# Patient Record
Sex: Female | Born: 1950 | Race: White | Hispanic: No | Marital: Married | Smoking: Never smoker
Health system: Southern US, Community
[De-identification: ages and names within clinical notes are randomized; demographics above are authoritative.]

## PROBLEM LIST (undated history)

## (undated) DIAGNOSIS — G629 Polyneuropathy, unspecified: Secondary | ICD-10-CM

## (undated) DIAGNOSIS — Z8659 Personal history of other mental and behavioral disorders: Secondary | ICD-10-CM

## (undated) HISTORY — PX: RHINOPLASTY: SUR1284

## (undated) HISTORY — PX: TONSILLECTOMY: SUR1361

## (undated) HISTORY — PX: CHOLECYSTECTOMY: SHX55

## (undated) HISTORY — PX: APPENDECTOMY: SHX54

## (undated) HISTORY — DX: Polyneuropathy, unspecified: G62.9

## (undated) HISTORY — DX: Personal history of other mental and behavioral disorders: Z86.59

---

## 2005-10-02 ENCOUNTER — Ambulatory Visit: Payer: Self-pay | Admitting: Internal Medicine

## 2007-12-06 ENCOUNTER — Ambulatory Visit: Payer: Self-pay | Admitting: Urology

## 2008-01-24 ENCOUNTER — Ambulatory Visit: Payer: Self-pay | Admitting: Internal Medicine

## 2010-12-12 ENCOUNTER — Ambulatory Visit: Payer: Self-pay | Admitting: Otolaryngology

## 2013-03-17 DIAGNOSIS — G4700 Insomnia, unspecified: Secondary | ICD-10-CM | POA: Insufficient documentation

## 2013-03-17 DIAGNOSIS — Z8659 Personal history of other mental and behavioral disorders: Secondary | ICD-10-CM | POA: Insufficient documentation

## 2014-05-03 DIAGNOSIS — G609 Hereditary and idiopathic neuropathy, unspecified: Secondary | ICD-10-CM | POA: Insufficient documentation

## 2014-06-05 DIAGNOSIS — G629 Polyneuropathy, unspecified: Secondary | ICD-10-CM | POA: Insufficient documentation

## 2016-08-25 ENCOUNTER — Other Ambulatory Visit: Payer: Self-pay | Admitting: Pediatrics

## 2016-08-25 DIAGNOSIS — N644 Mastodynia: Secondary | ICD-10-CM

## 2016-08-27 ENCOUNTER — Inpatient Hospital Stay
Admission: RE | Admit: 2016-08-27 | Discharge: 2016-08-27 | Disposition: A | Discharge status: Home / Self Care | Payer: Self-pay | Source: Ambulatory Visit | Attending: *Deleted | Admitting: *Deleted

## 2016-08-27 ENCOUNTER — Other Ambulatory Visit: Payer: Self-pay | Admitting: *Deleted

## 2016-08-27 ENCOUNTER — Other Ambulatory Visit: Payer: Self-pay | Admitting: Pediatrics

## 2016-08-27 DIAGNOSIS — Z9289 Personal history of other medical treatment: Secondary | ICD-10-CM

## 2016-08-27 DIAGNOSIS — N644 Mastodynia: Secondary | ICD-10-CM

## 2016-09-08 ENCOUNTER — Ambulatory Visit
Admission: RE | Admit: 2016-09-08 | Discharge: 2016-09-08 | Disposition: A | Discharge status: Home / Self Care | Payer: Medicare Other | Source: Ambulatory Visit | Attending: Pediatrics | Admitting: Pediatrics

## 2016-09-08 ENCOUNTER — Encounter: Payer: Self-pay | Admitting: Radiology

## 2016-09-08 DIAGNOSIS — N644 Mastodynia: Secondary | ICD-10-CM

## 2017-10-29 ENCOUNTER — Other Ambulatory Visit: Payer: Self-pay | Admitting: Otolaryngology

## 2017-10-29 DIAGNOSIS — R42 Dizziness and giddiness: Secondary | ICD-10-CM

## 2017-11-12 ENCOUNTER — Ambulatory Visit
Admission: RE | Admit: 2017-11-12 | Discharge: 2017-11-12 | Disposition: A | Discharge status: Home / Self Care | Payer: Medicare Other | Source: Ambulatory Visit | Attending: Otolaryngology | Admitting: Otolaryngology

## 2017-11-12 ENCOUNTER — Other Ambulatory Visit
Admission: RE | Admit: 2017-11-12 | Discharge: 2017-11-12 | Disposition: A | Discharge status: Home / Self Care | Payer: Medicare Other | Source: Ambulatory Visit | Attending: Otolaryngology | Admitting: Otolaryngology

## 2017-11-12 ENCOUNTER — Encounter (INDEPENDENT_AMBULATORY_CARE_PROVIDER_SITE_OTHER): Payer: Self-pay

## 2017-11-12 DIAGNOSIS — M47812 Spondylosis without myelopathy or radiculopathy, cervical region: Secondary | ICD-10-CM | POA: Diagnosis not present

## 2017-11-12 DIAGNOSIS — M4802 Spinal stenosis, cervical region: Secondary | ICD-10-CM | POA: Insufficient documentation

## 2017-11-12 DIAGNOSIS — R42 Dizziness and giddiness: Secondary | ICD-10-CM | POA: Diagnosis present

## 2017-11-12 LAB — CREATININE, SERUM
Creatinine, Ser: 0.9 mg/dL (ref 0.44–1.00)
GFR calc Af Amer: >60 mL/min (ref >60)
GFR calc non Af Amer: >60 mL/min (ref >60)

## 2017-11-12 MED ORDER — GADOBUTROL 1 MMOL/ML IV SOLN
5.0000 mL | Freq: Once | INTRAVENOUS | Status: AC | PRN
  Administered 2017-11-12: 5 mL via INTRAVENOUS

## 2018-11-22 ENCOUNTER — Other Ambulatory Visit: Payer: Self-pay

## 2018-11-22 ENCOUNTER — Encounter: Payer: Self-pay | Admitting: Gastroenterology

## 2018-11-22 ENCOUNTER — Ambulatory Visit (INDEPENDENT_AMBULATORY_CARE_PROVIDER_SITE_OTHER): Payer: Medicare Other | Admitting: Gastroenterology

## 2018-11-22 VITALS — BP 106/70 | HR 74 | Temp 98.0°F | Ht 63.0 in | Wt 128.0 lb

## 2018-11-22 DIAGNOSIS — K219 Gastro-esophageal reflux disease without esophagitis: Secondary | ICD-10-CM | POA: Diagnosis not present

## 2018-11-22 DIAGNOSIS — R197 Diarrhea, unspecified: Secondary | ICD-10-CM

## 2018-11-22 NOTE — Progress Notes (Signed)
Gastroenterology Consultation  Referring Provider:     Orene Desanctis, MD Primary Care Physician:  [facility] Primary Gastroenterologist:  Dr. Servando Sandoval     Reason for Consultation:     GERD and diarrhea        HPI:   Ann Sandoval is a 68 y.o. y/o female referred for consultation & management of GERD and diarrhea by Dr. Dan Sandoval, [facility].  The patient comes in today after being seen by ENT for recurrent runny nose and upper respiratory tract issues.  The patient reports that she was diagnosed with reflux and started on medication for this.  She is concerned about the long-term effects of a PPI.  She states that when this was all going on she was under a lot of stress and lost 18 pounds.  At the same time she was having some construction at her house and when that stopped she gained half the way back.  The patient denies any black stools or bloody stools and had a Cologuard test done recently that was negative.  She reports that she had 2 colonoscopies prior to that and reports that those was normal.  There is no report of any dysphagia associated with the reflux. The patient also reports that she has had diarrhea for many years with mucus passing from her bottom.  She denies the diarrhea waking her up at night but states that she has urgency to go to the bathroom shortly after eating.  She also notices that her diarrhea can be worse with different foods she eats. She endorses being under a lot of pressure and is somewhat emotional with me in the office today.  She is supposed to see somebody to talk to her about putting her on medication for her depression.  Past Medical History:  Diagnosis Date  . History of depression   . Polyneuropathy     Past Surgical History:  Procedure Laterality Date  . APPENDECTOMY    . CHOLECYSTECTOMY    . RHINOPLASTY    . TONSILLECTOMY      Prior to Admission medications   Not on File    Family History  Problem Relation Age of Onset   . Alzheimer's disease Mother   . Depression Mother   . Stroke Father   . Coronary artery disease Father   . Brain cancer Sister   . Depression Sister   . Coronary artery disease Brother   . Depression Maternal Grandmother   . Depression Maternal Grandfather   . Stroke Paternal Grandmother   . Stroke Paternal Grandfather   . Breast cancer Neg Hx      Social History   Tobacco Use  . Smoking status: Never Smoker  . Smokeless tobacco: Never Used  Substance Use Topics  . Alcohol use: Yes    Comment: Occasional alcohol  . Drug use: Never    Allergies as of 11/22/2018  . (No Known Allergies)    Review of Systems:    All systems reviewed and negative except where noted in HPI.   Physical Exam:  BP 106/70   Pulse 74   Temp 98 F (36.7 C) (Temporal)   Ht 5\' 3"  (1.6 m)   Wt 128 lb (58.1 kg)   BMI 22.67 kg/m  No LMP recorded. Patient is postmenopausal. General:   Alert,  Well-developed, well-nourished, pleasant and cooperative in NAD Head:  Normocephalic and atraumatic. Eyes:  Sclera clear, no icterus.   Conjunctiva pink. Ears:  Normal auditory  acuity. Nose:  No deformity, discharge, or lesions. Mouth:  No deformity or lesions,oropharynx pink & moist. Neck:  Supple; no masses or thyromegaly. Lungs:  Respirations even and unlabored.  Clear throughout to auscultation.   No wheezes, crackles, or rhonchi. No acute distress. Heart:  Regular rate and rhythm; no murmurs, clicks, rubs, or gallops. Abdomen:  Normal bowel sounds.  No bruits.  Soft, non-tender and non-distended without masses, hepatosplenomegaly or hernias noted.  No guarding or rebound tenderness.  Negative Carnett sign.   Rectal:  Deferred.  Msk:  Symmetrical without gross deformities.  Good, equal movement & strength bilaterally. Pulses:  Normal pulses noted. Extremities:  No clubbing or edema.  No cyanosis. Neurologic:  Alert and oriented x3;  grossly normal neurologically. Skin:  Intact without significant  lesions or rashes.  No jaundice. Lymph Nodes:  No significant cervical adenopathy. Psych:  Alert and cooperative. Normal mood and affect.  Imaging Studies: No results found.  Assessment and Plan:   Ann Sandoval is a 68 y.o. y/o female who comes in today with a history of heartburn when she was younger but now comes in with extraintestinal manifestations of reflux.  The patient has been explained the possible risks of taking a PPI and has also been explained the risks of not taking a PPI including continued upper respiratory tract issues, strictures and esophageal cancers from reflux. As for the diarrhea the patient has been told that it is possible that her diarrhea can be caused by her dairy intake since she has daily use of milk products.  She has been told to try and avoid milk products for 1 week to see if her symptoms improve.  If they do not the patient has been told to go back on dairy and start a high-fiber diet.  If her symptoms again do not improve then she may need to be tried on cholestyramine for possible bilious diarrhea since she has had her gallbladder out.  The patient has been explained the plan and agrees with it.     Ann Lame, MD. Marval Regal    Note: This dictation was prepared with Dragon dictation along with smaller phrase technology. Any transcriptional errors that result from this process are unintentional.

## 2019-01-19 ENCOUNTER — Telehealth: Payer: Self-pay | Admitting: Gastroenterology

## 2019-01-19 ENCOUNTER — Other Ambulatory Visit: Payer: Self-pay

## 2019-01-19 NOTE — Telephone Encounter (Signed)
Patient called & would like to know if Dr Servando Snare will continue to prescribe Pantoprazole 20mg  continued release? She uses [facility] .

## 2019-01-19 NOTE — Telephone Encounter (Signed)
Pt was hoping Dr Servando Snare would take over the rx for Pantoprazole. Ok'd to refill once she has completed her original rx given by ENT.

## 2019-01-19 NOTE — Telephone Encounter (Signed)
You can prescribe her the pantoprazole 20 mg that she was taking and no the medication does not have a separate preparation for extended release they are all indicated for once a day.

## 2019-01-19 NOTE — Telephone Encounter (Signed)
See message below. Does Pantoprazole come in extended release?

## 2019-03-05 ENCOUNTER — Ambulatory Visit: Payer: No Typology Code available for payment source

## 2019-03-06 ENCOUNTER — Ambulatory Visit: Payer: Medicare Other | Attending: Internal Medicine

## 2019-03-06 DIAGNOSIS — Z23 Encounter for immunization: Secondary | ICD-10-CM | POA: Insufficient documentation

## 2019-03-06 NOTE — Progress Notes (Signed)
   Covid-19 Vaccination Clinic  Name:  Ann Sandoval    MRN: [id number] DOB: 03-15-1950  03/06/2019  Ms. Ann Sandoval was observed post Covid-19 immunization for 15 minutes without incidence. She was provided with Vaccine Information Sheet and instruction to access the V-Safe system.   Ms. Ann Sandoval was instructed to call 911 with any severe reactions post vaccine: Marland Kitchen Difficulty breathing  . Swelling of your face and throat  . A fast heartbeat  . A bad rash all over your body  . Dizziness and weakness    Immunizations Administered    Name Date Dose VIS Date Route   Pfizer COVID-19 Vaccine 03/06/2019  3:11 PM 0.3 mL 12/24/2018 Intramuscular   Manufacturer: [employer], [employer]   Lot: [id number]   NDC: [address]-9

## 2019-03-15 ENCOUNTER — Ambulatory Visit: Payer: Self-pay

## 2019-03-29 ENCOUNTER — Ambulatory Visit: Payer: Medicare Other | Attending: Internal Medicine

## 2019-03-29 DIAGNOSIS — Z23 Encounter for immunization: Secondary | ICD-10-CM

## 2019-03-29 NOTE — Progress Notes (Signed)
   Covid-19 Vaccination Clinic  Name:  Ann Sandoval    MRN: [id number] DOB: 1950/07/03  03/29/2019  [name] was observed post Covid-19 immunization for 15 minutes without incident. She was provided with Vaccine Information Sheet and instruction to access the V-Safe system.   [name] was instructed to call 911 with any severe reactions post vaccine: Marland Kitchen Difficulty breathing  . Swelling of face and throat  . A fast heartbeat  . A bad rash all over body  . Dizziness and weakness   Immunizations Administered    Name Date Dose VIS Date Route   Pfizer COVID-19 Vaccine 03/29/2019  4:43 PM 0.3 mL 12/24/2018 Intramuscular   Manufacturer: [employer], [employer]   Lot: [id number]   NDC: [address]-4

## 2020-04-18 ENCOUNTER — Other Ambulatory Visit: Payer: Self-pay | Admitting: Otolaryngology

## 2020-04-18 ENCOUNTER — Other Ambulatory Visit (HOSPITAL_COMMUNITY): Payer: Self-pay | Admitting: Otolaryngology

## 2020-04-18 DIAGNOSIS — H9312 Tinnitus, left ear: Secondary | ICD-10-CM

## 2020-05-02 ENCOUNTER — Other Ambulatory Visit: Payer: Self-pay

## 2020-05-02 ENCOUNTER — Ambulatory Visit
Admission: RE | Admit: 2020-05-02 | Discharge: 2020-05-02 | Disposition: A | Discharge status: Home / Self Care | Payer: Medicare Other | Source: Ambulatory Visit | Attending: Otolaryngology | Admitting: Otolaryngology

## 2020-05-02 DIAGNOSIS — H9312 Tinnitus, left ear: Secondary | ICD-10-CM | POA: Diagnosis present

## 2020-05-02 MED ORDER — GADOBUTROL 1 MMOL/ML IV SOLN
5.0000 mL | Freq: Once | INTRAVENOUS | Status: AC | PRN
  Administered 2020-05-02: 5 mL via INTRAVENOUS

## 2020-10-29 ENCOUNTER — Other Ambulatory Visit: Payer: Self-pay | Admitting: Family Medicine

## 2020-10-29 DIAGNOSIS — E2839 Other primary ovarian failure: Secondary | ICD-10-CM

## 2020-10-29 DIAGNOSIS — Z1231 Encounter for screening mammogram for malignant neoplasm of breast: Secondary | ICD-10-CM

## 2022-09-08 ENCOUNTER — Other Ambulatory Visit: Payer: Self-pay | Admitting: Pediatrics

## 2022-09-08 DIAGNOSIS — E785 Hyperlipidemia, unspecified: Secondary | ICD-10-CM

## 2022-09-12 ENCOUNTER — Ambulatory Visit
Admission: RE | Admit: 2022-09-12 | Discharge: 2022-09-12 | Disposition: A | Discharge status: Home / Self Care | Payer: No Typology Code available for payment source | Source: Ambulatory Visit | Attending: Pediatrics | Admitting: Pediatrics

## 2022-09-12 DIAGNOSIS — E785 Hyperlipidemia, unspecified: Secondary | ICD-10-CM | POA: Insufficient documentation

## 2023-01-29 IMAGING — MR MR BRAIN/IAC WO/W CM
10 of 13 series · 26 of 48 positions shown · IV contrast (gadavist)
Comparison: 11/12/2017

CLINICAL DATA: Bilateral tinnitus

EXAM:
MRI HEAD WITHOUT AND WITH CONTRAST
TECHNIQUE: Multiplanar, multiecho pulse sequences of the brain and surrounding
structures were obtained without and with intravenous contrast.
CONTRAST:  5mL GADAVIST GADOBUTROL 1 MMOL/ML IV SOLN

[Series 5: T1 · sagittal · 5.0mm · 0.62mm/px · 1 of 25 slices shown (1 of 3)]
[im 1/25]
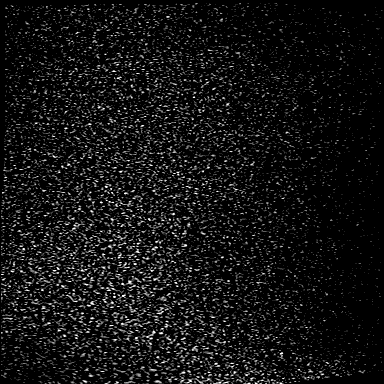

[Series 6: ax dwi_tracew · axial · 3.0mm · 0.60mm/px · z∈[-165,-19]mm · 4 of 48 slices shown]
[im 1/48]
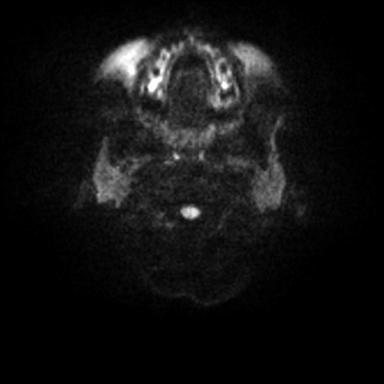
[im 16/48]
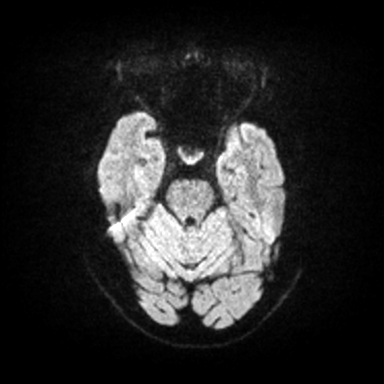
[im 32/48]
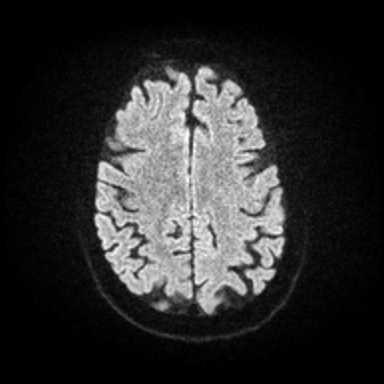
[im 48/48]
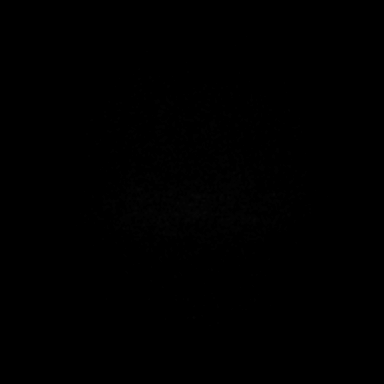

[Series 7: ax dwi_adc · axial · 3.0mm · 0.60mm/px · z∈[-165,-75]mm · 3 of 45 slices shown]
[im 1/45]
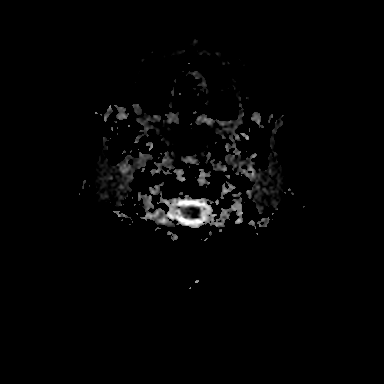
[im 15/45]
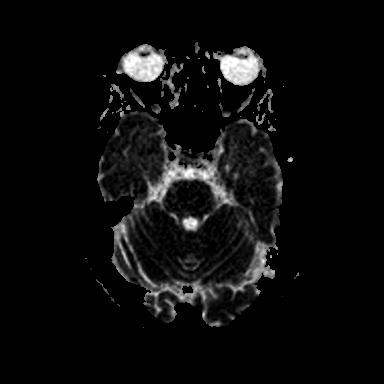
[im 30/45]
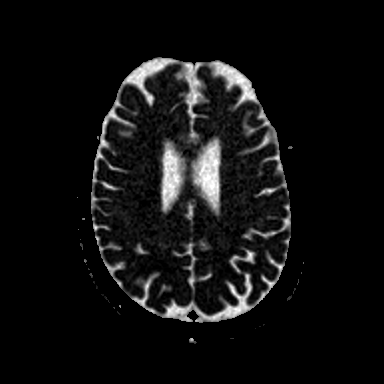

[Series 8: T2 · axial · 5.0mm · 0.53mm/px · z∈[-158,-22]mm · 2 of 25 slices shown]
[im 1/25]
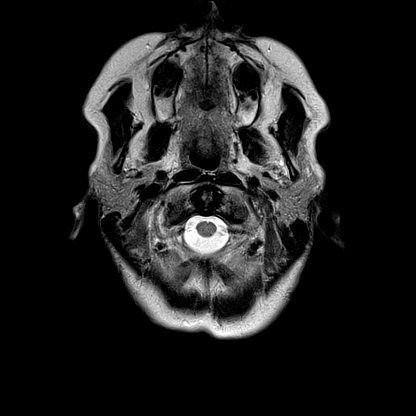
[im 25/25]
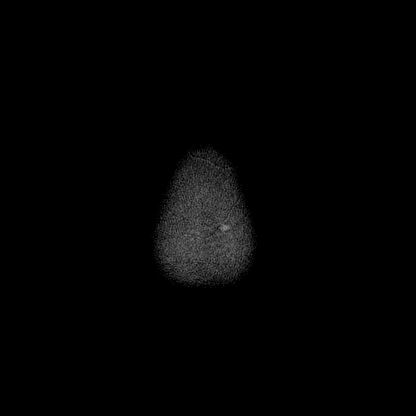

[Series 13: FLAIR · axial · 3.0mm · 0.53mm/px · z∈[-166,-13]mm · 4 of 55 slices shown]
[im 1/55]
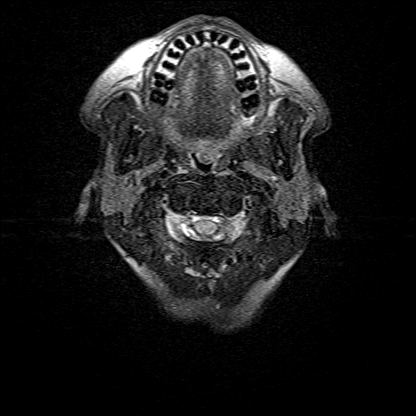
[im 19/55]
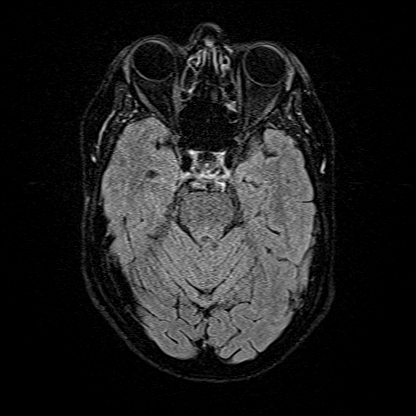
[im 37/55]
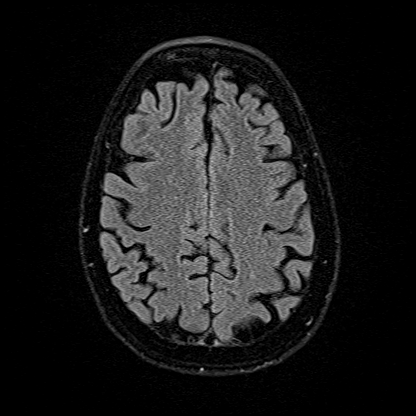
[im 55/55]
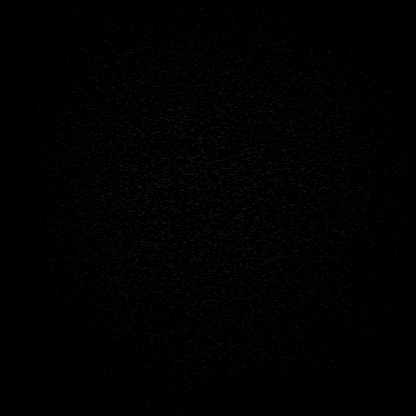

[Series 14: T1 · coronal · non-contrast · 3.0mm · 0.21mm/px · 1 of 13 slices shown (2 of 3)]
[im 1/13]
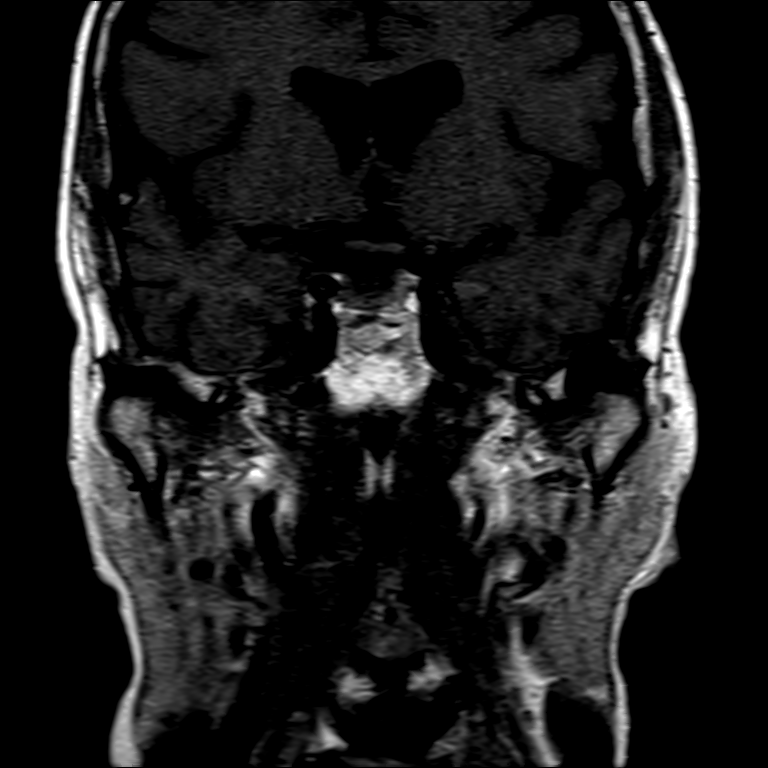

[Series 16: T1 · axial · non-contrast · 3.0mm · 0.21mm/px · 1 of 15 slices shown (3 of 3)]
[im 1/15]
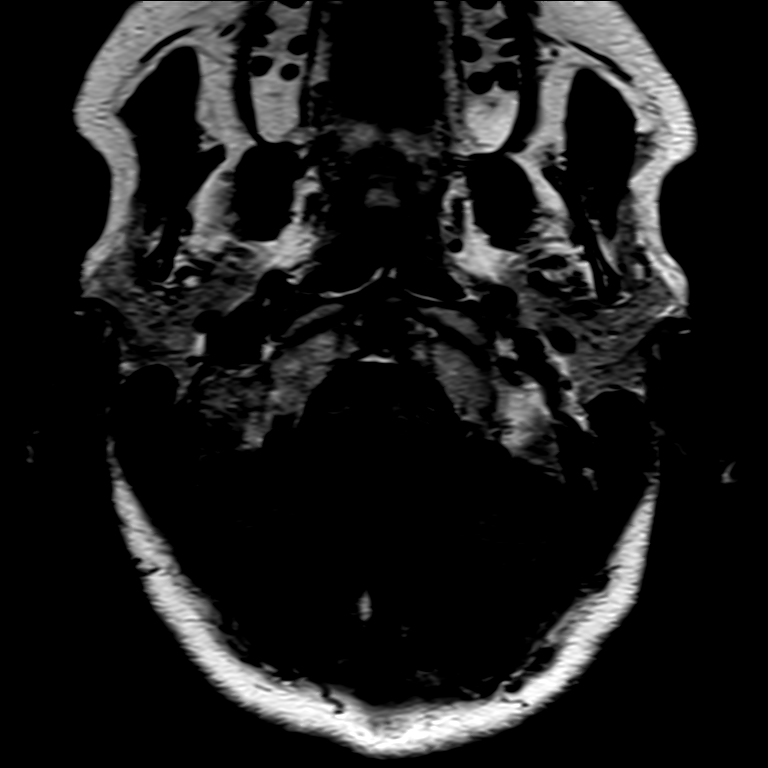

[Series 17: T1 post-contrast · axial · 3.0mm · 0.21mm/px · 1 of 15 slices shown (1 of 3)]
[im 1/15]
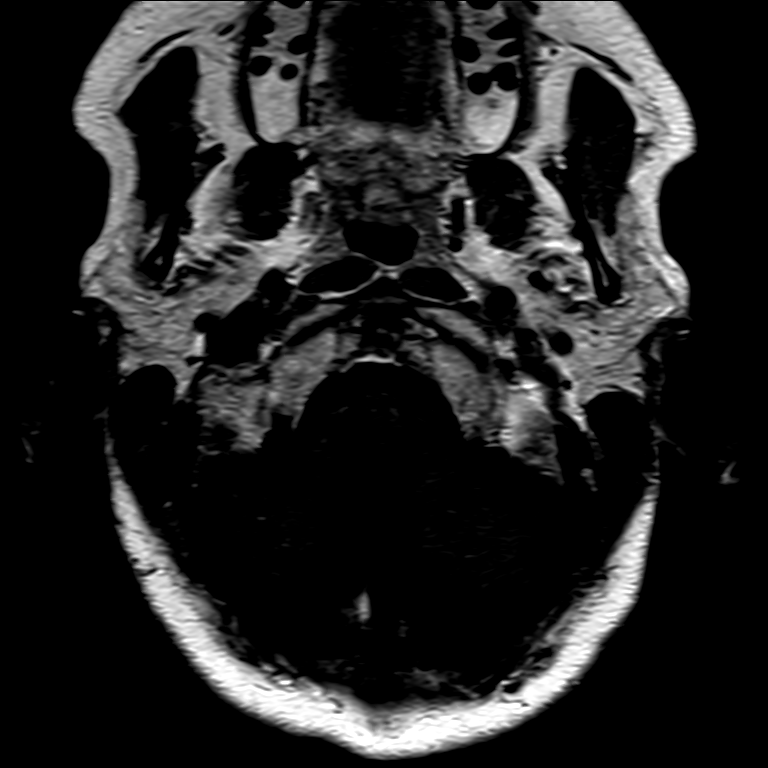

[Series 18: T1 post-contrast · coronal · 3.0mm · 0.21mm/px · 1 of 13 slices shown (2 of 3)]
[im 1/13]
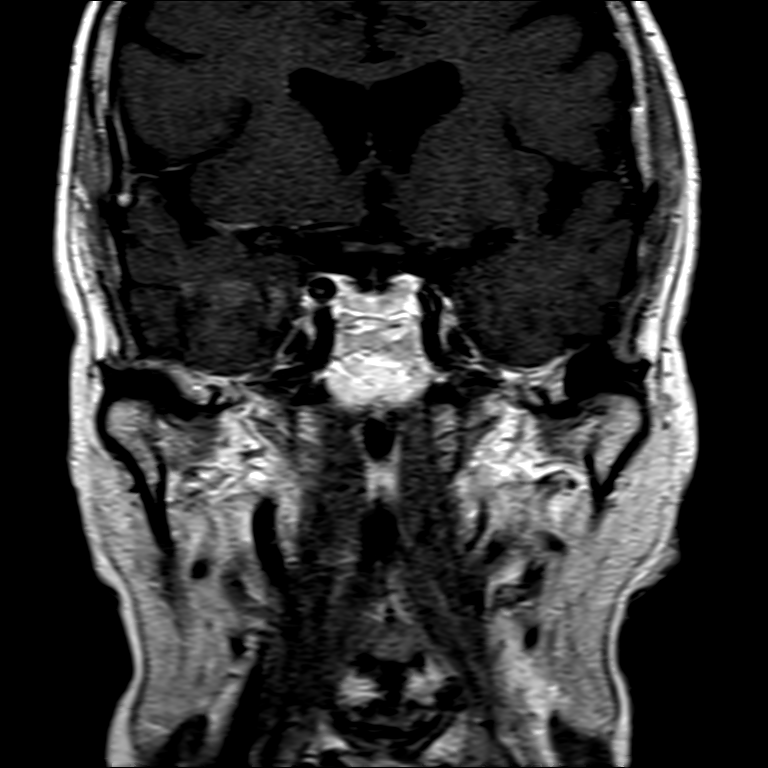

[Series 19: T1 post-contrast · axial · 1.0mm · 0.98mm/px · z∈[-178,-12]mm · 8 of 176 slices shown (3 of 3)]
[im 1/176]
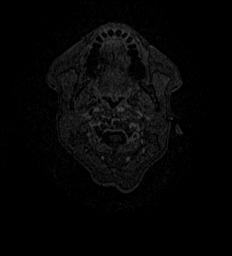
[im 27/176]
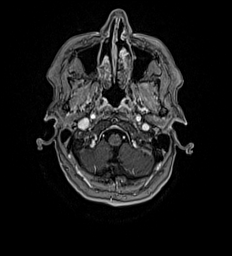
[im 54/176]
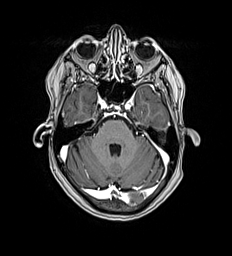
[im 81/176]
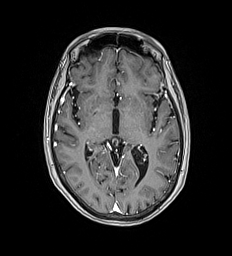
[im 95/176]
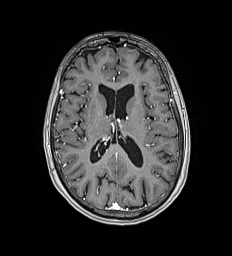
[im 122/176]
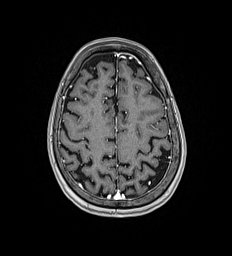
[im 149/176]
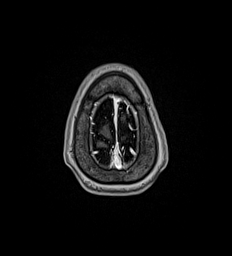
[im 176/176]
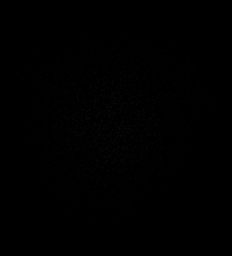

[26 of 48 positions shown; findings below may reference images not displayed]

FINDINGS: Brain: There is no cerebellopontine angle mass. Inner ear structures
demonstrate an unremarkable MR appearance. There is no abnormal
enhancement within the internal auditory canals.

Ventricles and sulci are within normal limits in size and
configuration. Punctate focus of susceptibility within the left
superior frontal gyrus is compatible with chronic microhemorrhage.
No acute infarction or intracranial hemorrhage. There is no mass
effect or edema. There is no extra-axial fluid collection.

Vascular: Major vessel flow voids at the skull base are preserved.

Skull and upper cervical spine: Normal marrow signal is preserved.

Sinuses/Orbits: Minor mucosal thickening. The orbits are
unremarkable.

Other: The sella is unremarkable.  Mastoid air cells are clear.
IMPRESSION: No evidence of recent infarction, mass, or abnormal enhancement.

## 2023-02-03 DIAGNOSIS — M7582 Other shoulder lesions, left shoulder: Secondary | ICD-10-CM | POA: Diagnosis not present

## 2023-02-03 DIAGNOSIS — N951 Menopausal and female climacteric states: Secondary | ICD-10-CM | POA: Diagnosis not present

## 2023-02-03 DIAGNOSIS — E538 Deficiency of other specified B group vitamins: Secondary | ICD-10-CM | POA: Diagnosis not present

## 2023-02-03 DIAGNOSIS — G4719 Other hypersomnia: Secondary | ICD-10-CM | POA: Diagnosis not present

## 2023-02-03 DIAGNOSIS — R7303 Prediabetes: Secondary | ICD-10-CM | POA: Diagnosis not present

## 2023-02-03 DIAGNOSIS — R5383 Other fatigue: Secondary | ICD-10-CM | POA: Diagnosis not present

## 2023-02-03 DIAGNOSIS — H9313 Tinnitus, bilateral: Secondary | ICD-10-CM | POA: Diagnosis not present

## 2023-02-05 DIAGNOSIS — K080 Exfoliation of teeth due to systemic causes: Secondary | ICD-10-CM | POA: Diagnosis not present

## 2023-02-26 DIAGNOSIS — H903 Sensorineural hearing loss, bilateral: Secondary | ICD-10-CM | POA: Diagnosis not present

## 2023-02-26 DIAGNOSIS — H9313 Tinnitus, bilateral: Secondary | ICD-10-CM | POA: Diagnosis not present

## 2023-05-25 ENCOUNTER — Ambulatory Visit (INDEPENDENT_AMBULATORY_CARE_PROVIDER_SITE_OTHER): Admitting: Family Medicine

## 2023-05-25 VITALS — BP 110/70 | HR 76 | Ht 63.0 in | Wt 127.4 lb

## 2023-05-25 DIAGNOSIS — M7521 Bicipital tendinitis, right shoulder: Secondary | ICD-10-CM

## 2023-05-25 DIAGNOSIS — M7540 Impingement syndrome of unspecified shoulder: Secondary | ICD-10-CM | POA: Diagnosis not present

## 2023-05-25 MED ORDER — MELOXICAM 15 MG PO TABS: 15.0000 mg | ORAL_TABLET | Freq: Every day | ORAL | 0 refills | Status: DC

## 2023-06-01 ENCOUNTER — Telehealth: Payer: Self-pay

## 2023-06-01 ENCOUNTER — Encounter: Payer: Self-pay | Admitting: Family Medicine

## 2023-06-01 DIAGNOSIS — M7540 Impingement syndrome of unspecified shoulder: Secondary | ICD-10-CM | POA: Insufficient documentation

## 2023-06-01 DIAGNOSIS — M7521 Bicipital tendinitis, right shoulder: Secondary | ICD-10-CM | POA: Insufficient documentation

## 2023-06-01 NOTE — Assessment & Plan Note (Signed)
 History of Present Illness Ann Sandoval is a 73 year old female who presents with bilateral shoulder pain. She is accompanied by a female companion who assists with her memory.  Bilateral shoulder pain began in the right shoulder in September 2024 and in the left shoulder around February 2025. The pain has progressed to a constant ache in both shoulders, primarily located in the front and radiating down the biceps, occasionally extending past the elbow. It is exacerbated by movements such as reaching overhead, dressing, and sleeping, as she cannot lay on her shoulders. She is unable to perform activities she previously could, such as swimming, doing push-ups, and reaching behind her back.  An x-ray of the right shoulder from September 2024 showed minor arthritis at the acromioclavicular joint but no significant arthritis in the ball and socket joint. There is no x-ray available for the left shoulder.  She has tried Celebrex for the shoulder pain but discontinued it due to lack of significant relief. She has not tried other medications like Tylenol or topical rubs and relies on herbal supplements. She has not been compliant with regular use of anti-inflammatory medications.  Her sleep is disturbed due to shoulder pain, and she experiences difficulty with daily activities due to limited shoulder mobility.  Physical Exam PALPATION: Minor tenderness at the right acromioclavicular joint. Non-tender at the bicipital groove, subacromial space, and trapezius. RANGE OF MOTION: Foreflexion with a 25-degree deficit bilaterally, painful. Full abduction elicits pain bilaterally. External rotation to 45 degrees from neutral elicits pain. STRENGTH: 5/5 strength in external rotation, painless against resistance. 5/5 strength in internal rotation bilaterally, minor discomfort. SPECIAL TESTS: Minor discomfort with isolated supraspinatus testing bilaterally, 5/5 strength. Positive Neer's test on the right. Positive  Zoila Hines' test on the right. Positive Speed's test. Equivocal to positive Yergason's test on the right. Equivocal Neer's test on the left. Equivocal Zoila Hines' test on the left. Negative Speed's test on the left. Negative Yergason's test on the left.  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 Positive Neer's and Zoila Hines' tests on the right. No significant rotator cuff tear. Likely due to overuse from swimming and exercise. Discussed inflammation control, activity modification, anti-inflammatory medications, and potential cortisone injections. Emphasized tailored exercise program to strengthen the rotator cuff without symptom aggravation. - Prescribe meloxicam  for one month, daily for first two weeks, assess improvement.  Take this medication with food. - Contact provider if no improvement after two weeks for potential cortisone injection. - Provide home exercise program focused on rotator cuff conditioning, to start once improvement is noted, ideally after two weeks. - Advise against upper body exercises that may exacerbate symptoms, such as bicep curls and shoulder presses, until improvement is noted. - Schedule follow-up appointment in one month.

## 2023-06-01 NOTE — Patient Instructions (Signed)
 Patient Plan  1. Right Shoulder Biceps Tendonitis and Rotator Cuff Impingement    - Take meloxicam  once daily for inflammation control, with food, for one month. Assess improvement after two weeks.    - If no improvement after two weeks, contact the provider to discuss the possibility of a cortisone injection.    - Begin a home exercise program for rotator cuff and biceps tendonitis once improvement is noted, ideally after two weeks.    - Avoid upper body exercises that could worsen symptoms, such as bicep curls and shoulder presses, until you notice improvement.    - Schedule a follow-up appointment in one month to evaluate progress.  Red Flags: - If you experience severe pain, increased swelling, or inability to move your shoulder, contact the office immediately.

## 2023-06-01 NOTE — Progress Notes (Signed)
 Primary Care / Sports Medicine Office Visit  Patient Information:  Patient ID: Ann Sandoval, female DOB: [birth year] Age: 73 y.o. MRN: [id number]   Ann Sandoval is a pleasant 73 y.o. female presenting with the following:  Chief Complaint  Patient presents with   Shoulder Pain    Bil shoulder pain since September. Having difficulty lifting, changing clothes, ROM. Patient occasionally taking Celebrex. Patient states this happened over night. She was exercising  and living normal life before this happened. She has pain radiating down both arms to the elbows and in her biceps. No weakness, numbness, or tingling. No recent imaging.     Vitals:   05/25/23 1548  BP: 110/70  Pulse: 76  SpO2: 93%   Vitals:   05/25/23 1548  Weight: 127 lb 6.4 oz (57.8 kg)  Height: 5\' 3"  (1.6 m)   Body mass index is 22.57 kg/m.  No results found.   Independent interpretation of notes and tests performed by another provider:   None  Procedures performed:   None  Pertinent History, Exam, Impression, and Recommendations:   Problem List Items Addressed This Visit     Biceps tendinitis, right   Biceps tendonitis, right shoulder Right shoulder biceps tendonitis contributing to anterior shoulder pain. Positive Speed's and Yergason's tests. Likely secondary to overuse from swimming. Discussed inflammation control and activity modification, emphasizing anti-inflammatory medications and potential cortisone injections. - Include biceps tendonitis management in the overall plan for rotator cuff impingement syndrome, focusing on inflammation control and activity modification. - Prescribe meloxicam  as part of the treatment plan for inflammation control. - Provide home exercise program including exercises for biceps tendonitis, to start once improvement is noted.      Relevant Medications   meloxicam  (MOBIC ) 15 MG tablet   Rotator cuff impingement syndrome - Primary   History of Present Illness Ann Sandoval is a 73 year old female who presents with bilateral shoulder pain. She is accompanied by a female companion who assists with her memory.  Bilateral shoulder pain began in the right shoulder in September 2024 and in the left shoulder around February 2025. The pain has progressed to a constant ache in both shoulders, primarily located in the front and radiating down the biceps, occasionally extending past the elbow. It is exacerbated by movements such as reaching overhead, dressing, and sleeping, as she cannot lay on her shoulders. She is unable to perform activities she previously could, such as swimming, doing push-ups, and reaching behind her back.  An x-ray of the right shoulder from September 2024 showed minor arthritis at the acromioclavicular joint but no significant arthritis in the ball and socket joint. There is no x-ray available for the left shoulder.  She has tried Celebrex for the shoulder pain but discontinued it due to lack of significant relief. She has not tried other medications like Tylenol or topical rubs and relies on herbal supplements. She has not been compliant with regular use of anti-inflammatory medications.  Her sleep is disturbed due to shoulder pain, and she experiences difficulty with daily activities due to limited shoulder mobility.  Physical Exam PALPATION: Minor tenderness at the right acromioclavicular joint. Non-tender at the bicipital groove, subacromial space, and trapezius. RANGE OF MOTION: Foreflexion with a 25-degree deficit bilaterally, painful. Full abduction elicits pain bilaterally. External rotation to 45 degrees from neutral elicits pain. STRENGTH: 5/5 strength in external rotation, painless against resistance. 5/5 strength in internal rotation bilaterally, minor discomfort. SPECIAL TESTS: Minor discomfort with isolated supraspinatus testing  bilaterally, 5/5 strength. Positive Neer's test on the right. Positive Zoila Hines' test on the right. Positive  Speed's test. Equivocal to positive Yergason's test on the right. Equivocal Neer's test on the left. Equivocal Zoila Hines' test on the left. Negative Speed's test on the left. Negative Yergason's test on the left.  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 Positive Neer's and Zoila Hines' tests on the right. No significant rotator cuff tear. Likely due to overuse from swimming and exercise. Discussed inflammation control, activity modification, anti-inflammatory medications, and potential cortisone injections. Emphasized tailored exercise program to strengthen the rotator cuff without symptom aggravation. - Prescribe meloxicam  for one month, daily for first two weeks, assess improvement.  Take this medication with food. - Contact provider if no improvement after two weeks for potential cortisone injection. - Provide home exercise program focused on rotator cuff conditioning, to start once improvement is noted, ideally after two weeks. - Advise against upper body exercises that may exacerbate symptoms, such as bicep curls and shoulder presses, until improvement is noted. - Schedule follow-up appointment in one month.      Relevant Medications   meloxicam  (MOBIC ) 15 MG tablet     Orders & Medications Medications:  Meds ordered this encounter  Medications   meloxicam  (MOBIC ) 15 MG tablet    Sig: Take 1 tablet (15 mg total) by mouth daily. X 2 weeks the daily PRN. Take with food.    Dispense:  30 tablet    Refill:  0   No orders of the defined types were placed in this encounter.    Return in about 4 weeks (around 06/22/2023) for f/u shoulder.     Ma Saupe, MD, [facility]   Primary Care Sports Medicine Primary Care and  Sports Medicine at [facility]

## 2023-06-01 NOTE — Telephone Encounter (Signed)
 Copied from CRM [id number]. Topic: General - Other >> May 29, 2023  4:28 PM Sophia H wrote: Reason for CRM: Pt under the impression that she should be starting her physical therapy but is not sure, please advise.  Best contact [phone]

## 2023-06-01 NOTE — Assessment & Plan Note (Signed)
 Biceps tendonitis, right shoulder Right shoulder biceps tendonitis contributing to anterior shoulder pain. Positive Speed's and Yergason's tests. Likely secondary to overuse from swimming. Discussed inflammation control and activity modification, emphasizing anti-inflammatory medications and potential cortisone injections. - Include biceps tendonitis management in the overall plan for rotator cuff impingement syndrome, focusing on inflammation control and activity modification. - Prescribe meloxicam  as part of the treatment plan for inflammation control. - Provide home exercise program including exercises for biceps tendonitis, to start once improvement is noted.

## 2023-06-02 NOTE — Telephone Encounter (Signed)
 Spoke with patient and she has set up appt as of this morning. JM

## 2023-06-04 ENCOUNTER — Ambulatory Visit: Attending: Family Medicine

## 2023-06-04 DIAGNOSIS — M25512 Pain in left shoulder: Secondary | ICD-10-CM | POA: Diagnosis not present

## 2023-06-04 DIAGNOSIS — M7540 Impingement syndrome of unspecified shoulder: Secondary | ICD-10-CM | POA: Diagnosis not present

## 2023-06-04 DIAGNOSIS — M25611 Stiffness of right shoulder, not elsewhere classified: Secondary | ICD-10-CM | POA: Diagnosis not present

## 2023-06-04 DIAGNOSIS — M25612 Stiffness of left shoulder, not elsewhere classified: Secondary | ICD-10-CM | POA: Diagnosis not present

## 2023-06-04 DIAGNOSIS — M7521 Bicipital tendinitis, right shoulder: Secondary | ICD-10-CM | POA: Insufficient documentation

## 2023-06-04 DIAGNOSIS — G8929 Other chronic pain: Secondary | ICD-10-CM | POA: Insufficient documentation

## 2023-06-04 DIAGNOSIS — M25511 Pain in right shoulder: Secondary | ICD-10-CM | POA: Insufficient documentation

## 2023-06-04 NOTE — Therapy (Signed)
 OUTPATIENT PHYSICAL THERAPY SHOULDER EVALUATION   Patient Name: Ann Sandoval MRN: [id number] DOB:[birth year], 73 y.o., female Today's Date: 06/04/2023  END OF SESSION:  PT End of Session - 06/04/23 1624     Visit Number 1    Number of Visits 17    Date for PT Re-Evaluation 07/30/23    PT Start Time 1616    PT Stop Time 1700    PT Time Calculation (min) 44 min    Activity Tolerance Patient tolerated treatment well    Behavior During Therapy WFL for tasks assessed/performed             Past Medical History:  Diagnosis Date   History of depression    Polyneuropathy    Past Surgical History:  Procedure Laterality Date   APPENDECTOMY     CHOLECYSTECTOMY     RHINOPLASTY     TONSILLECTOMY     Patient Active Problem List   Diagnosis Date Noted   Rotator cuff impingement syndrome 06/01/2023   Biceps tendinitis, right 06/01/2023   Polyneuropathy 06/05/2014   Idiopathic peripheral neuropathy 05/03/2014   History of depression 03/17/2013   Insomnia 03/17/2013    PCP: [facility] [place]  REFERRING PROVIDER: Ma Saupe, MD  REFERRING DIAG:  M75.40 (ICD-10-CM) - Rotator cuff impingement syndrome, unspecified laterality  M75.21 (ICD-10-CM) - Biceps tendinitis, right    THERAPY DIAG:  Chronic pain of both shoulders  Decreased ROM of right shoulder  Decreased ROM of left shoulder  Rotator cuff impingement syndrome, unspecified laterality  Biceps tendinitis, right  Rationale for Evaluation and Treatment: Rehabilitation  ONSET DATE: 09/14/2022  SUBJECTIVE:                                                                                                                                                                                      SUBJECTIVE STATEMENT:  Pt reports that she was swimming and injured her R shoulder in September of 2024, but the pain that she was experiencing has now starting to occur bilaterally.  Pt notes that she has neuropathy  in the feet and has had that for a long period of time.  Pt also reports she used to dance and her mom was an Charity fundraiser and did Yoga into her 65's, her dad was a PE teacher and chopped wood even after 3 strokes.  Pt tells this information because she is not used to having pain with movement.  Pt is an active individual and has experienced a significant reduction in her ROM, although it may be normal for others.  Hand dominance: Right  PERTINENT HISTORY: Bil shoulder pain since September. Having difficulty lifting, changing clothes,  ROM. Patient occasionally taking Celebrex. Patient states this happened over night. She was exercising and living normal life before this happened. She has pain radiating down both arms to the elbows and in her biceps. No weakness, numbness, or tingling. No recent imaging.   PAIN:  Are you having pain? Yes: NPRS scale: 0/10 not moving; 8/10 when reaching for things at an angle Pain location: Bilaterally, starting at the shoulder and radiating down the biceps Pain description: Sharp and shooting Aggravating factors: Reaching for items at an angle, or picking up items low and underneath shelf, dressing, picking up coffee mugs Relieving factors: Not moving the shoulder and a little relief from the meloxicam   PRECAUTIONS: ICD/Pacemaker  RED FLAGS: None   WEIGHT BEARING RESTRICTIONS: No  FALLS:  Has patient fallen in last 6 months? No  LIVING ENVIRONMENT: Lives with: lives with their spouse Lives in: House/apartment Stairs: Yes: External: 8 steps; on right going up Has following equipment at home: Single point cane and None  OCCUPATION: Retired  PLOF: Independent  PATIENT GOALS:To get stronger and pain free in bilateral shoulders.  Reduce fear of pain with doing exercises.  NEXT MD VISIT: Unsure  OBJECTIVE:  Note: Objective measures were completed at Evaluation unless otherwise noted.  DIAGNOSTIC FINDINGS:    Results RADIOLOGY Right shoulder X-ray: Mild  arthritis at the acromioclavicular joint, no glenohumeral arthritis (10/13/2022)  PATIENT SURVEYS:  Quick Dash 50%  COGNITION: Overall cognitive status: Within functional limits for tasks assessed     SENSATION: WFL  POSTURE: Good posture  UPPER EXTREMITY ROM:   Active ROM Right eval Left eval  Shoulder flexion 112* 146*  Shoulder abduction 113* 114*  (Blank rows = not tested)  UPPER EXTREMITY MMT:  MMT Right eval Left eval  Shoulder flexion 3+* 3+  Shoulder extension    Shoulder abduction 3+* 3+  Shoulder adduction    Shoulder internal rotation    Shoulder external rotation    Middle trapezius    Lower trapezius    Elbow flexion 3+* 3+  Elbow extension 3+* 4-*  Wrist flexion 4+ 4+  Wrist extension 4+ 4-  Grip strength (lbs) 40.5# 40#  (Blank rows = not tested)  SHOULDER SPECIAL TESTS: Impingement tests: Neer impingement test: positive , Hawkins/Kennedy impingement test: positive , and Painful arc test: negative SLAP lesions: Biceps load test: positive  Rotator cuff assessment: Empty can test: positive , Full can test: positive , External rotation lag sign: positive , and Belly press test: positive    JOINT MOBILITY TESTING:  Pt with good mobility in the cervical spine, just tender with upglides.  Pt also with good mobility when assessing the shoulder, just tender and uncomfortable with the pressure applied by the therapist according to the pt.  PALPATION:  Pt with significant TP's noted in the infraspinatus, supraspinatus, UT, and subscapularis bilaterally.  TREATMENT DATE: 06/04/23  Self-Care  Pt educated on the musculature that makes up the RTC and the special tests regarding RTC involvement.  Pt given verbal instruction on exercise intensity in order to prevent further injury to the shoulders.  Pt also educated on potential of  cervical radiculopathy, however based on assessment with cervical mobility testing, pt did not have any radicular symptoms while performing. Therapist utilized images on web to show the location of musculature and the referral patterns of each muscle in the RTC.  Pt verbalized understanding of the teaching.   PATIENT EDUCATION: Education details: Pt educated on role of PT and services provided during current POC, along with prognosis and information about the clinic. Person educated: Patient Education method: Medical illustrator Education comprehension: verbalized understanding  HOME EXERCISE PROGRAM: To be given at next visit.  Likely to perform isometric exercises to target muscles without causing any damage.  ASSESSMENT:  CLINICAL IMPRESSION: Patient is a 73 y.o. female who was seen today for physical therapy evaluation and treatment for B shoulder pain.  Pt presents with physical impairments of decreased activity tolerance, decreased ROM of B shoulders with the R>L in deficits, increased pain in B shoulders with mobility and lifting items, and decreased strength in B shoulders as noted.  Pt also has significant TP's that may be causing the radicular symptoms in the main RTC muscles, supraspinatus, infraspinatus, and subscapularis.  Pt would likely benefit from manual techniques and dry needling to assist with targeting these restricted muscles and treat the radicular symptoms.  Pt will benefit from skilled therapy to address tolerance, ROM, pain, and strength impairments necessary for improvement in quality of life.  Pt. demonstrates understanding of this plan of care and agrees with this plan.    OBJECTIVE IMPAIRMENTS: decreased activity tolerance, decreased ROM, decreased strength, impaired UE functional use, and pain.   ACTIVITY LIMITATIONS: carrying, lifting, toileting, dressing, reach over head, and hygiene/grooming  PARTICIPATION LIMITATIONS: meal prep, cleaning, laundry,  driving, shopping, community activity, and yard work  PERSONAL FACTORS: Age, Education, Fitness, Past/current experiences, Time since onset of injury/illness/exacerbation, and 1 comorbidity: polyneuropathy are also affecting patient's functional outcome.   REHAB POTENTIAL: Good  CLINICAL DECISION MAKING: Evolving/moderate complexity  EVALUATION COMPLEXITY: Moderate   GOALS: Goals reviewed with patient? Yes  SHORT TERM GOALS: Target date: 07/02/2023  Pt will be independent with HEP in order to demonstrate increased ability to perform tasks related to occupation/hobbies. Baseline:  To be given at the next session Goal status: INITIAL  LONG TERM GOALS: Target date: 07/30/2023  Pt will decrease quick DASH score by at least 8% in order to demonstrate clinically significant reduction in disability.  Baseline: 50% Goal status: INITIAL  2.  Pt will improve B UE ROM to be WNL (180 deg flexion; 180 deg abduction) to indicate an improved ROM necessary for ADL's and orth IADL's necessary for improved QoL. Baseline:   R Flexion: 112*; L Flexion: 146*;  R Abduction: 113*, L Abduction: 114* Goal status: INITIAL  3.  Pt will reduce overall pain level to 2/10 with overhead activity by utilizing a combination of stretching, strengthening exercises, and pain-reducing modalities in order to improve overall QoL. Baseline: 8/10 pain with residual effects after movement Goal status: INITIAL  PLAN:  PT FREQUENCY: 2x/week  PT DURATION: 8 weeks  PLANNED INTERVENTIONS: 97750- Physical Performance Testing, 97110-Therapeutic exercises, 97530- Therapeutic activity, 97112- Neuromuscular re-education, 97535- Self Care, [id number]- Manual therapy, Taping, Dry Needling, Joint mobilization, Joint manipulation, Spinal manipulation, Spinal mobilization,  Cryotherapy, and Moist heat  PLAN FOR NEXT SESSION:  Generate HEP, likely give isometric exercises Dry needle subscap, infra, and supraspinatus of the bilateral  shoulders and see if referral pattern dissipates. Continue with arm strengthening exercises within the given ROM that is not painful   Rozanna Corner, PT, DPT Physical Therapist - [facility]  06/04/23, 6:34 PM

## 2023-06-10 ENCOUNTER — Ambulatory Visit

## 2023-06-10 DIAGNOSIS — M25612 Stiffness of left shoulder, not elsewhere classified: Secondary | ICD-10-CM

## 2023-06-10 DIAGNOSIS — G8929 Other chronic pain: Secondary | ICD-10-CM

## 2023-06-10 DIAGNOSIS — M7540 Impingement syndrome of unspecified shoulder: Secondary | ICD-10-CM | POA: Diagnosis not present

## 2023-06-10 DIAGNOSIS — M7521 Bicipital tendinitis, right shoulder: Secondary | ICD-10-CM

## 2023-06-10 DIAGNOSIS — M25511 Pain in right shoulder: Secondary | ICD-10-CM | POA: Diagnosis not present

## 2023-06-10 DIAGNOSIS — M25611 Stiffness of right shoulder, not elsewhere classified: Secondary | ICD-10-CM

## 2023-06-10 DIAGNOSIS — M25512 Pain in left shoulder: Secondary | ICD-10-CM | POA: Diagnosis not present

## 2023-06-10 NOTE — Therapy (Incomplete)
 OUTPATIENT PHYSICAL THERAPY SHOULDER EVALUATION   Patient Name: Ann Sandoval MRN: [id number] DOB:05/08/1950, 74 y.o., female Today's Date: 06/10/2023  END OF SESSION:    Past Medical History:  Diagnosis Date   History of depression    Polyneuropathy    Past Surgical History:  Procedure Laterality Date   APPENDECTOMY     CHOLECYSTECTOMY     RHINOPLASTY     TONSILLECTOMY     Patient Active Problem List   Diagnosis Date Noted   Rotator cuff impingement syndrome 06/01/2023   Biceps tendinitis, right 06/01/2023   Polyneuropathy 06/05/2014   Idiopathic peripheral neuropathy 05/03/2014   History of depression 03/17/2013   Insomnia 03/17/2013    PCP: [facility] [place]  REFERRING PROVIDER: Ma Saupe, MD  REFERRING DIAG:  M75.40 (ICD-10-CM) - Rotator cuff impingement syndrome, unspecified laterality  M75.21 (ICD-10-CM) - Biceps tendinitis, right    THERAPY DIAG:  No diagnosis found.  Rationale for Evaluation and Treatment: Rehabilitation  ONSET DATE: 09/14/2022  SUBJECTIVE:                                                                                                                                                                                      SUBJECTIVE STATEMENT:  Pt reports that she was swimming and injured her R shoulder in September of 2024, but the pain that she was experiencing has now starting to occur bilaterally.  Pt notes that she has neuropathy in the feet and has had that for a long period of time.  Pt also reports she used to dance and her mom was an Charity fundraiser and did Yoga into her 58's, her dad was a PE teacher and chopped wood even after 3 strokes.  Pt tells this information because she is not used to having pain with movement.  Pt is an active individual and has experienced a significant reduction in her ROM, although it may be normal for others.  Hand dominance: Right  PERTINENT HISTORY: Bil shoulder pain since September. Having  difficulty lifting, changing clothes, ROM. Patient occasionally taking Celebrex. Patient states this happened over night. She was exercising and living normal life before this happened. She has pain radiating down both arms to the elbows and in her biceps. No weakness, numbness, or tingling. No recent imaging.   PAIN:  Are you having pain? Yes: NPRS scale: 0/10 not moving; 8/10 when reaching for things at an angle Pain location: Bilaterally, starting at the shoulder and radiating down the biceps Pain description: Sharp and shooting Aggravating factors: Reaching for items at an angle, or picking up items low and underneath shelf, dressing, picking up coffee mugs Relieving factors: Not  moving the shoulder and a little relief from the meloxicam   PRECAUTIONS: ICD/Pacemaker  RED FLAGS: None   WEIGHT BEARING RESTRICTIONS: No  FALLS:  Has patient fallen in last 6 months? No  LIVING ENVIRONMENT: Lives with: lives with their spouse Lives in: House/apartment Stairs: Yes: External: 8 steps; on right going up Has following equipment at home: Single point cane and None  OCCUPATION: Retired  PLOF: Independent  PATIENT GOALS:To get stronger and pain free in bilateral shoulders.  Reduce fear of pain with doing exercises.  NEXT MD VISIT: Unsure  OBJECTIVE:  Note: Objective measures were completed at Evaluation unless otherwise noted.  DIAGNOSTIC FINDINGS:    Results RADIOLOGY Right shoulder X-ray: Mild arthritis at the acromioclavicular joint, no glenohumeral arthritis (10/13/2022)  PATIENT SURVEYS:  Quick Dash 50%  COGNITION: Overall cognitive status: Within functional limits for tasks assessed     SENSATION: WFL  POSTURE: Good posture  UPPER EXTREMITY ROM:   Active ROM Right eval Left eval  Shoulder flexion 112* 146*  Shoulder abduction 113* 114*  (Blank rows = not tested)  UPPER EXTREMITY MMT:  MMT Right eval Left eval  Shoulder flexion 3+* 3+  Shoulder  extension    Shoulder abduction 3+* 3+  Shoulder adduction    Shoulder internal rotation    Shoulder external rotation    Middle trapezius    Lower trapezius    Elbow flexion 3+* 3+  Elbow extension 3+* 4-*  Wrist flexion 4+ 4+  Wrist extension 4+ 4-  Grip strength (lbs) 40.5# 40#  (Blank rows = not tested)  SHOULDER SPECIAL TESTS: Impingement tests: Neer impingement test: positive , Hawkins/Kennedy impingement test: positive , and Painful arc test: negative SLAP lesions: Biceps load test: positive  Rotator cuff assessment: Empty can test: positive , Full can test: positive , External rotation lag sign: positive , and Belly press test: positive    JOINT MOBILITY TESTING:  Pt with good mobility in the cervical spine, just tender with upglides.  Pt also with good mobility when assessing the shoulder, just tender and uncomfortable with the pressure applied by the therapist according to the pt.  PALPATION:  Pt with significant TP's noted in the infraspinatus, supraspinatus, UT, and subscapularis bilaterally.                                                                                                                             TREATMENT DATE: 06/10/23  R elbow and shoulder strengthening HEP- isometrics  Dry needle subscap, infra, and supraspinatus of the bilateral shoulders and see if referral pattern dissipates.   PATIENT EDUCATION: Education details: Pt educated on role of PT and services provided during current POC, along with prognosis and information about the clinic. Person educated: Patient Education method: Medical illustrator Education comprehension: verbalized understanding  HOME EXERCISE PROGRAM: To be given at next visit.  Likely to perform isometric exercises to target muscles without causing any damage.  ASSESSMENT:  CLINICAL IMPRESSION:  Patient is a 73 y.o. female who was seen today for physical therapy evaluation and treatment for B shoulder  pain.  Pt presents with physical impairments of decreased activity tolerance, decreased ROM of B shoulders with the R>L in deficits, increased pain in B shoulders with mobility and lifting items, and decreased strength in B shoulders as noted.  Pt also has significant TP's that may be causing the radicular symptoms in the main RTC muscles, supraspinatus, infraspinatus, and subscapularis.  Pt would likely benefit from manual techniques and dry needling to assist with targeting these restricted muscles and treat the radicular symptoms.  Pt will benefit from skilled therapy to address tolerance, ROM, pain, and strength impairments necessary for improvement in quality of life.  Pt. demonstrates understanding of this plan of care and agrees with this plan.    OBJECTIVE IMPAIRMENTS: decreased activity tolerance, decreased ROM, decreased strength, impaired UE functional use, and pain.   ACTIVITY LIMITATIONS: carrying, lifting, toileting, dressing, reach over head, and hygiene/grooming  PARTICIPATION LIMITATIONS: meal prep, cleaning, laundry, driving, shopping, community activity, and yard work  PERSONAL FACTORS: Age, Education, Fitness, Past/current experiences, Time since onset of injury/illness/exacerbation, and 1 comorbidity: polyneuropathy are also affecting patient's functional outcome.   REHAB POTENTIAL: Good  CLINICAL DECISION MAKING: Evolving/moderate complexity  EVALUATION COMPLEXITY: Moderate   GOALS: Goals reviewed with patient? Yes  SHORT TERM GOALS: Target date: 07/02/2023  Pt will be independent with HEP in order to demonstrate increased ability to perform tasks related to occupation/hobbies. Baseline:  To be given at the next session Goal status: INITIAL  LONG TERM GOALS: Target date: 07/30/2023  Pt will decrease quick DASH score by at least 8% in order to demonstrate clinically significant reduction in disability.  Baseline: 50% Goal status: INITIAL  2.  Pt will improve B UE  ROM to be WNL (180 deg flexion; 180 deg abduction) to indicate an improved ROM necessary for ADL's and orth IADL's necessary for improved QoL. Baseline:   R Flexion: 112*; L Flexion: 146*;  R Abduction: 113*, L Abduction: 114* Goal status: INITIAL  3.  Pt will reduce overall pain level to 2/10 with overhead activity by utilizing a combination of stretching, strengthening exercises, and pain-reducing modalities in order to improve overall QoL. Baseline: 8/10 pain with residual effects after movement Goal status: INITIAL  PLAN:  PT FREQUENCY: 2x/week  PT DURATION: 8 weeks  PLANNED INTERVENTIONS: 97750- Physical Performance Testing, 97110-Therapeutic exercises, 97530- Therapeutic activity, 97112- Neuromuscular re-education, 97535- Self Care, 13086- Manual therapy, Taping, Dry Needling, Joint mobilization, Joint manipulation, Spinal manipulation, Spinal mobilization, Cryotherapy, and Moist heat  PLAN FOR NEXT SESSION:  Generate HEP, likely give isometric exercises Dry needle subscap, infra, and supraspinatus of the bilateral shoulders and see if referral pattern dissipates. Continue with arm strengthening exercises within the given ROM that is not painful   Note: Portions of this document were prepared using Dragon voice recognition software and although reviewed may contain unintentional dictation errors in syntax, grammar, or spelling.  Edwina Gram PT ,DPT Physical Therapist- [facility]   06/10/23, 11:58 AM

## 2023-06-10 NOTE — Therapy (Signed)
 OUTPATIENT PHYSICAL THERAPY SHOULDER TREATMENT   Patient Name: Ann Sandoval MRN: [id number] DOB:09-16-1950, 73 y.o., female Today's Date: 06/11/2023  END OF SESSION:  PT End of Session - 06/10/23 1058     Visit Number 2    Number of Visits 17    Date for PT Re-Evaluation 07/30/23    PT Start Time 1617    PT Stop Time 1658    PT Time Calculation (min) 41 min    Activity Tolerance Patient tolerated treatment well;No increased pain    Behavior During Therapy WFL for tasks assessed/performed              Past Medical History:  Diagnosis Date   History of depression    Polyneuropathy    Past Surgical History:  Procedure Laterality Date   APPENDECTOMY     CHOLECYSTECTOMY     RHINOPLASTY     TONSILLECTOMY     Patient Active Problem List   Diagnosis Date Noted   Rotator cuff impingement syndrome 06/01/2023   Biceps tendinitis, right 06/01/2023   Polyneuropathy 06/05/2014   Idiopathic peripheral neuropathy 05/03/2014   History of depression 03/17/2013   Insomnia 03/17/2013    PCP: [facility] [place]  REFERRING PROVIDER: Ma Saupe, MD  REFERRING DIAG:  M75.40 (ICD-10-CM) - Rotator cuff impingement syndrome, unspecified laterality  M75.21 (ICD-10-CM) - Biceps tendinitis, right    THERAPY DIAG:  Chronic pain of both shoulders  Decreased ROM of right shoulder  Decreased ROM of left shoulder  Rotator cuff impingement syndrome, unspecified laterality  Biceps tendinitis, right  Rationale for Evaluation and Treatment: Rehabilitation  ONSET DATE: 09/14/2022  SUBJECTIVE:                                                                                                                                                                                      SUBJECTIVE STATEMENT:  Today: Patient reports she is feeling the same with B shoulder pain. States she is still very active but slowed way down due to shoulder pain. States she doesn't feel like her  anti-inflammatory is helping much. She requests attempting dry needling as discussed with PT during Evaluation.   From EVAL: Pt reports that she was swimming and injured her R shoulder in September of 2024, but the pain that she was experiencing has now starting to occur bilaterally.  Pt notes that she has neuropathy in the feet and has had that for a long period of time.  Pt also reports she used to dance and her mom was an Charity fundraiser and did Yoga into her 80's, her dad was a PE teacher and chopped wood even after 3 strokes.  Pt tells this information because she is not used to having pain with movement.  Pt is an active individual and has experienced a significant reduction in her ROM, although it may be normal for others.  Hand dominance: Right  PERTINENT HISTORY: Bil shoulder pain since September. Having difficulty lifting, changing clothes, ROM. Patient occasionally taking Celebrex. Patient states this happened over night. She was exercising and living normal life before this happened. She has pain radiating down both arms to the elbows and in her biceps. No weakness, numbness, or tingling. No recent imaging.   PAIN:  Are you having pain? Yes: NPRS scale: 0/10 not moving; 8/10 when reaching for things at an angle Pain location: Bilaterally, starting at the shoulder and radiating down the biceps Pain description: Sharp and shooting Aggravating factors: Reaching for items at an angle, or picking up items low and underneath shelf, dressing, picking up coffee mugs Relieving factors: Not moving the shoulder and a little relief from the meloxicam   PRECAUTIONS: ICD/Pacemaker  RED FLAGS: None   WEIGHT BEARING RESTRICTIONS: No  FALLS:  Has patient fallen in last 6 months? No  LIVING ENVIRONMENT: Lives with: lives with their spouse Lives in: House/apartment Stairs: Yes: External: 8 steps; on right going up Has following equipment at home: Single point cane and None  OCCUPATION: Retired  PLOF:  Independent  PATIENT GOALS:To get stronger and pain free in bilateral shoulders.  Reduce fear of pain with doing exercises.  NEXT MD VISIT: Unsure  OBJECTIVE:  Note: Objective measures were completed at Evaluation unless otherwise noted.  DIAGNOSTIC FINDINGS:    Results RADIOLOGY Right shoulder X-ray: Mild arthritis at the acromioclavicular joint, no glenohumeral arthritis (10/13/2022)  PATIENT SURVEYS:  Quick Dash 50%  COGNITION: Overall cognitive status: Within functional limits for tasks assessed     SENSATION: WFL  POSTURE: Good posture  UPPER EXTREMITY ROM:   Active ROM Right eval Left eval  Shoulder flexion 112* 146*  Shoulder abduction 113* 114*  (Blank rows = not tested)  UPPER EXTREMITY MMT:  MMT Right eval Left eval  Shoulder flexion 3+* 3+  Shoulder extension    Shoulder abduction 3+* 3+  Shoulder adduction    Shoulder internal rotation    Shoulder external rotation    Middle trapezius    Lower trapezius    Elbow flexion 3+* 3+  Elbow extension 3+* 4-*  Wrist flexion 4+ 4+  Wrist extension 4+ 4-  Grip strength (lbs) 40.5# 40#  (Blank rows = not tested)  SHOULDER SPECIAL TESTS: Impingement tests: Neer impingement test: positive , Hawkins/Kennedy impingement test: positive , and Painful arc test: negative SLAP lesions: Biceps load test: positive  Rotator cuff assessment: Empty can test: positive , Full can test: positive , External rotation lag sign: positive , and Belly press test: positive    JOINT MOBILITY TESTING:  Pt with good mobility in the cervical spine, just tender with upglides.  Pt also with good mobility when assessing the shoulder, just tender and uncomfortable with the pressure applied by the therapist according to the pt.  PALPATION:  Pt with significant TP's noted in the infraspinatus, supraspinatus, UT, and subscapularis bilaterally.  TREATMENT DATE: 06/11/23  Trigger Point Dry Needling  Initial Treatment: Pt instructed on Dry Needling rational, procedures, and possible side effects. Pt instructed to expect mild to moderate muscle soreness later in the day and/or into the next day.  Pt instructed in methods to reduce muscle soreness. Pt instructed to continue prescribed HEP. Because Dry Needling was performed over or adjacent to a lung field, pt was educated on S/S of pneumothorax and to seek immediate medical attention should they occur.  Patient was educated on signs and symptoms of infection and other risk factors and advised to seek medical attention should they occur.  Patient verbalized understanding of these instructions and education.   Patient Verbal Consent Given: Yes Education Handout Provided: Yes Muscles Treated: Bilateral Supraspinatus and infraspinatus Electrical Stimulation Performed: No Treatment Response/Outcome: 2 local twitch responses with R side supraspinatus and infraspinatus. Patient denied any pain and minimal Bleeding - quickly ceased with wiping with alcohol pad. No obvious bruising or soreness observed or reported.  Therex:  Instruction in isometric (pain-free) RTC strengthening: -Standing ER (dorsum of hand pushing into towel roll at wall) - hold 5 sec x 10 -Standing IR (palm pushing into towel roll at wall) - hold 5 sec x 10 -Standing Shoulder abd (instructed in both 90 deg elbow flexed position and straight arm) x 10 reps   Self care/home management:   Explanation of dry needling- reading handout and explaining purpose and technique.  Intro to RTC muscles - viewing images of muscle and explaining role with movement of shoulder. Added above mentioned therex into HEP (see above and below for details)     PATIENT EDUCATION: Education details: Pt educated on role of PT and services provided during current POC, along with prognosis and information about  the clinic. Person educated: Patient Education method: Medical illustrator Education comprehension: verbalized understanding  HOME EXERCISE PROGRAM: Access Code: [email or web address] URL: [email or web address] Date: 06/10/2023 Prepared by: Ferrell Hu  Exercises - Standing Isometric Shoulder External Rotation with Doorway and Towel Roll  - 1 x daily - 2 sets - 10 reps - 5 sec hold - Standing Isometric Shoulder Internal Rotation with Towel Roll at Doorway  - 1 x daily - 2 sets - 10 reps - 5 hold - Standing Isometric Shoulder Abduction with Doorway - Arm Bent  - 1 x daily - 2 sets - 10 reps - 5 hold  ASSESSMENT:  CLINICAL IMPRESSION: Patient is a 73 y.o. female who was seen today for physical therapy treatment for B shoulder pain.  Patient present with some pain- palpated along supraspinatus and infraspinatus region.   Introduced dry needling and focused on 2 RC muscles and good response - no immediate pain relief but did observe 2 local twitch responses and no adverse reaction. Issued handout explaining dry needling and reviewed with patient today. Then introduced some beginner pain free RC strengthening in isometric fashion to decrease stress to muscle and avoid impingement symptoms. Patient will benefit from review and progression of RTC strengthening as appropriate.  Pt will benefit from skilled therapy to address tolerance, ROM, pain, and strength impairments necessary for improvement in quality of life.  Pt. demonstrates understanding of this plan of care and agrees with this plan.    OBJECTIVE IMPAIRMENTS: decreased activity tolerance, decreased ROM, decreased strength, impaired UE functional use, and pain.   ACTIVITY LIMITATIONS: carrying, lifting, toileting, dressing, reach over head, and hygiene/grooming  PARTICIPATION LIMITATIONS: meal prep, cleaning, laundry, driving, shopping, community activity, and yard work  PERSONAL FACTORS:  Age, Education, Fitness,  Past/current experiences, Time since onset of injury/illness/exacerbation, and 1 comorbidity: polyneuropathy are also affecting patient's functional outcome.   REHAB POTENTIAL: Good  CLINICAL DECISION MAKING: Evolving/moderate complexity  EVALUATION COMPLEXITY: Moderate   GOALS: Goals reviewed with patient? Yes  SHORT TERM GOALS: Target date: 07/02/2023  Pt will be independent with HEP in order to demonstrate increased ability to perform tasks related to occupation/hobbies. Baseline:  To be given at the next session Goal status: INITIAL  LONG TERM GOALS: Target date: 07/30/2023  Pt will decrease quick DASH score by at least 8% in order to demonstrate clinically significant reduction in disability.  Baseline: 50% Goal status: INITIAL  2.  Pt will improve B UE ROM to be WNL (180 deg flexion; 180 deg abduction) to indicate an improved ROM necessary for ADL's and orth IADL's necessary for improved QoL. Baseline:   R Flexion: 112*; L Flexion: 146*;  R Abduction: 113*, L Abduction: 114* Goal status: INITIAL  3.  Pt will reduce overall pain level to 2/10 with overhead activity by utilizing a combination of stretching, strengthening exercises, and pain-reducing modalities in order to improve overall QoL. Baseline: 8/10 pain with residual effects after movement Goal status: INITIAL  PLAN:  PT FREQUENCY: 2x/week  PT DURATION: 8 weeks  PLANNED INTERVENTIONS: 97750- Physical Performance Testing, 97110-Therapeutic exercises, 97530- Therapeutic activity, 97112- Neuromuscular re-education, 97535- Self Care, 40981- Manual therapy, Taping, Dry Needling, Joint mobilization, Joint manipulation, Spinal manipulation, Spinal mobilization, Cryotherapy, and Moist heat  PLAN FOR NEXT SESSION:  Progress HEP, isometric exercises and pain free AROM/AAROM Dry needle subscap, infra, supraspinatus, biceps, etc...  of the bilateral shoulders and see if referral pattern dissipates.    Ossie Blend,  PT Physical Therapist - [facility]  06/11/23, 11:18 AM

## 2023-06-15 ENCOUNTER — Ambulatory Visit: Attending: Family Medicine

## 2023-06-15 DIAGNOSIS — M25511 Pain in right shoulder: Secondary | ICD-10-CM | POA: Insufficient documentation

## 2023-06-15 DIAGNOSIS — M25512 Pain in left shoulder: Secondary | ICD-10-CM | POA: Insufficient documentation

## 2023-06-15 DIAGNOSIS — M25612 Stiffness of left shoulder, not elsewhere classified: Secondary | ICD-10-CM | POA: Insufficient documentation

## 2023-06-15 DIAGNOSIS — M7540 Impingement syndrome of unspecified shoulder: Secondary | ICD-10-CM | POA: Insufficient documentation

## 2023-06-15 DIAGNOSIS — M7521 Bicipital tendinitis, right shoulder: Secondary | ICD-10-CM | POA: Insufficient documentation

## 2023-06-15 DIAGNOSIS — G8929 Other chronic pain: Secondary | ICD-10-CM | POA: Diagnosis not present

## 2023-06-15 DIAGNOSIS — M25611 Stiffness of right shoulder, not elsewhere classified: Secondary | ICD-10-CM | POA: Insufficient documentation

## 2023-06-15 NOTE — Therapy (Signed)
 OUTPATIENT PHYSICAL THERAPY SHOULDER TREATMENT   Patient Name: Ann Sandoval MRN: [id number] DOB:[birth year], 73 y.o., female Today's Date: 06/16/2023  END OF SESSION:  PT End of Session - 06/15/23 1046     Number of Visits 17    Date for PT Re-Evaluation 07/30/23    PT Start Time 1400    PT Stop Time 1443    PT Time Calculation (min) 43 min    Activity Tolerance Patient tolerated treatment well;No increased pain    Behavior During Therapy WFL for tasks assessed/performed               Past Medical History:  Diagnosis Date   History of depression    Polyneuropathy    Past Surgical History:  Procedure Laterality Date   APPENDECTOMY     CHOLECYSTECTOMY     RHINOPLASTY     TONSILLECTOMY     Patient Active Problem List   Diagnosis Date Noted   Rotator cuff impingement syndrome 06/01/2023   Biceps tendinitis, right 06/01/2023   Polyneuropathy 06/05/2014   Idiopathic peripheral neuropathy 05/03/2014   History of depression 03/17/2013   Insomnia 03/17/2013    PCP: [facility] [place]  REFERRING PROVIDER: Ma Saupe, MD  REFERRING DIAG:  M75.40 (ICD-10-CM) - Rotator cuff impingement syndrome, unspecified laterality  M75.21 (ICD-10-CM) - Biceps tendinitis, right    THERAPY DIAG:  Chronic pain of both shoulders  Decreased ROM of right shoulder  Decreased ROM of left shoulder  Rotator cuff impingement syndrome, unspecified laterality  Biceps tendinitis, right  Rationale for Evaluation and Treatment: Rehabilitation  ONSET DATE: 09/14/2022  SUBJECTIVE:                                                                                                                                                                                      SUBJECTIVE STATEMENT:  Today: Patient reports she is feeling the same with B shoulder pain. States she wants to make today the last day and appreciate for PT to educate on any exercises that she can perform at home.  She declined any further DN or PT interventions after today.    From EVAL: Pt reports that she was swimming and injured her R shoulder in September of 2024, but the pain that she was experiencing has now starting to occur bilaterally.  Pt notes that she has neuropathy in the feet and has had that for a long period of time.  Pt also reports she used to dance and her mom was an Charity fundraiser and did Yoga into her 60's, her dad was a PE teacher and chopped wood even after 3 strokes.  Pt tells this information  because she is not used to having pain with movement.  Pt is an active individual and has experienced a significant reduction in her ROM, although it may be normal for others.  Hand dominance: Right  PERTINENT HISTORY: Bil shoulder pain since September. Having difficulty lifting, changing clothes, ROM. Patient occasionally taking Celebrex. Patient states this happened over night. She was exercising and living normal life before this happened. She has pain radiating down both arms to the elbows and in her biceps. No weakness, numbness, or tingling. No recent imaging.   PAIN:  Are you having pain? Yes: NPRS scale: 0/10 not moving; 8/10 when reaching for things at an angle Pain location: Bilaterally, starting at the shoulder and radiating down the biceps Pain description: Sharp and shooting Aggravating factors: Reaching for items at an angle, or picking up items low and underneath shelf, dressing, picking up coffee mugs Relieving factors: Not moving the shoulder and a little relief from the meloxicam   PRECAUTIONS: ICD/Pacemaker  RED FLAGS: None   WEIGHT BEARING RESTRICTIONS: No  FALLS:  Has patient fallen in last 6 months? No  LIVING ENVIRONMENT: Lives with: lives with their spouse Lives in: House/apartment Stairs: Yes: External: 8 steps; on right going up Has following equipment at home: Single point cane and None  OCCUPATION: Retired  PLOF: Independent  PATIENT GOALS:To get stronger and  pain free in bilateral shoulders.  Reduce fear of pain with doing exercises.  NEXT MD VISIT: Unsure  OBJECTIVE:  Note: Objective measures were completed at Evaluation unless otherwise noted.  DIAGNOSTIC FINDINGS:    Results RADIOLOGY Right shoulder X-ray: Mild arthritis at the acromioclavicular joint, no glenohumeral arthritis (10/13/2022)  PATIENT SURVEYS:  Quick Dash 50%  COGNITION: Overall cognitive status: Within functional limits for tasks assessed     SENSATION: WFL  POSTURE: Good posture  UPPER EXTREMITY ROM:   Active ROM Right eval Left eval  Shoulder flexion 112* 146*  Shoulder abduction 113* 114*  (Blank rows = not tested)  UPPER EXTREMITY MMT:  MMT Right eval Left eval  Shoulder flexion 3+* 3+  Shoulder extension    Shoulder abduction 3+* 3+  Shoulder adduction    Shoulder internal rotation    Shoulder external rotation    Middle trapezius    Lower trapezius    Elbow flexion 3+* 3+  Elbow extension 3+* 4-*  Wrist flexion 4+ 4+  Wrist extension 4+ 4-  Grip strength (lbs) 40.5# 40#  (Blank rows = not tested)  SHOULDER SPECIAL TESTS: Impingement tests: Neer impingement test: positive , Hawkins/Kennedy impingement test: positive , and Painful arc test: negative SLAP lesions: Biceps load test: positive  Rotator cuff assessment: Empty can test: positive , Full can test: positive , External rotation lag sign: positive , and Belly press test: positive    JOINT MOBILITY TESTING:  Pt with good mobility in the cervical spine, just tender with upglides.  Pt also with good mobility when assessing the shoulder, just tender and uncomfortable with the pressure applied by the therapist according to the pt.  PALPATION:  Pt with significant TP's noted in the infraspinatus, supraspinatus, UT, and subscapularis bilaterally.  TREATMENT  DATE: 06/15/23   Therex:  Instruction in (pain-free) RTC strengthening: -Standing ER using GTB 2 x 10 reps each side -Standing IR-using GTB 2 x 10 reps 2 x 10 -Standing Shoulder abd (painfree) GTB x 10 reps  - Demonstrated alt method for ER- using BUE with elbows flexed to 90  x10 reps  NMR: For postural kinesthetics -Scap row- GTB 2 x 10 reps  -shoulder ext GTB 2 x 10 reps -Horizontal Shoulder ABD x 10 reps  Self care/home management:   Explanation of RTC muscles and role they play in shoulder stabilization - viewing images of muscle and explaining role with movement of shoulder. Added above mentioned therex into HEP (see above and below for details)     PATIENT EDUCATION: Education details: Pt educated on role of PT and services provided during current POC, along with prognosis and information about the clinic. Person educated: Patient Education method: Medical illustrator Education comprehension: verbalized understanding  HOME EXERCISE PROGRAM:  Access Code: [email or web address] URL: [email or web address] Date: 06/15/2023 Prepared by: Ferrell Hu  Exercises - Shoulder External Rotation with Anchored Resistance  - 3 x weekly - 3 sets - 10 reps - Shoulder External Rotation and Scapular Retraction with Resistance  - 3 x weekly - 3 sets - 10 reps - Shoulder Internal Rotation with Resistance  - 3 x weekly - 3 sets - 10 reps - Standing Single Arm Shoulder Abduction with Resistance  - 3 x weekly - 3 sets - 10 reps - Standing Shoulder Row with Anchored Resistance  - 3 x weekly - 3 sets - 10 reps - Shoulder extension with resistance - Neutral  - 3 x weekly - 3 sets - 10 reps - Standing Shoulder Horizontal Abduction with Resistance  - 3 x weekly - 3 sets - 10 reps      Access Code: [email or web address] URL: [email or web address] Date: 06/10/2023 Prepared by: Ferrell Hu  Exercises - Standing Isometric Shoulder External Rotation with Doorway  and Towel Roll  - 1 x daily - 2 sets - 10 reps - 5 sec hold - Standing Isometric Shoulder Internal Rotation with Towel Roll at Doorway  - 1 x daily - 2 sets - 10 reps - 5 hold - Standing Isometric Shoulder Abduction with Doorway - Arm Bent  - 1 x daily - 2 sets - 10 reps - 5 hold  ASSESSMENT:  CLINICAL IMPRESSION: Patient is a 73 y.o. female who was seen today for physical therapy treatment for B shoulder pain.  Patient requested to make today her last day so treatment focused on educating in pain free activities for rotator cuff strengthening. She was able to follow all verbal cues and prompting today with good results- able to complete activities without increased pain. Per her request issued addition progression of activities to assist in long term management of shoulder pain and weakness. Did not reassess goals as patient requested discharge on 2nd visit. Patient discharged per her request with HEP issued.     OBJECTIVE IMPAIRMENTS: decreased activity tolerance, decreased ROM, decreased strength, impaired UE functional use, and pain.   ACTIVITY LIMITATIONS: carrying, lifting, toileting, dressing, reach over head, and hygiene/grooming  PARTICIPATION LIMITATIONS: meal prep, cleaning, laundry, driving, shopping, community activity, and yard work  PERSONAL FACTORS: Age, Education, Fitness, Past/current experiences, Time since onset of injury/illness/exacerbation, and 1 comorbidity: polyneuropathy are also affecting patient's functional outcome.   REHAB POTENTIAL: Good  CLINICAL DECISION MAKING: Evolving/moderate complexity  EVALUATION COMPLEXITY: Moderate   GOALS: Goals reviewed with patient?  Yes  SHORT TERM GOALS: Target date: 07/02/2023  Pt will be independent with HEP in order to demonstrate increased ability to perform tasks related to occupation/hobbies. Baseline:  To be given at the next session Goal status: INITIAL  LONG TERM GOALS: Target date: 07/30/2023  Pt will decrease  quick DASH score by at least 8% in order to demonstrate clinically significant reduction in disability.  Baseline: 50% Goal status: INITIAL  2.  Pt will improve B UE ROM to be WNL (180 deg flexion; 180 deg abduction) to indicate an improved ROM necessary for ADL's and orth IADL's necessary for improved QoL. Baseline:   R Flexion: 112*; L Flexion: 146*;  R Abduction: 113*, L Abduction: 114* Goal status: INITIAL  3.  Pt will reduce overall pain level to 2/10 with overhead activity by utilizing a combination of stretching, strengthening exercises, and pain-reducing modalities in order to improve overall QoL. Baseline: 8/10 pain with residual effects after movement Goal status: INITIAL  PLAN:  PT FREQUENCY: 2x/week  PT DURATION: 8 weeks  PLANNED INTERVENTIONS: 97750- Physical Performance Testing, 97110-Therapeutic exercises, 97530- Therapeutic activity, 97112- Neuromuscular re-education, 97535- Self Care, 14782- Manual therapy, Taping, Dry Needling, Joint mobilization, Joint manipulation, Spinal manipulation, Spinal mobilization, Cryotherapy, and Moist heat  PLAN FOR NEXT SESSION:  Discharge per patient request    Ossie Blend, PT Physical Therapist - [facility]  06/16/23, 10:58 AM

## 2023-06-17 ENCOUNTER — Ambulatory Visit: Admitting: Physical Therapy

## 2023-06-18 ENCOUNTER — Ambulatory Visit

## 2023-06-22 ENCOUNTER — Ambulatory Visit

## 2023-06-22 ENCOUNTER — Other Ambulatory Visit: Payer: Self-pay | Admitting: Family Medicine

## 2023-06-22 DIAGNOSIS — M7521 Bicipital tendinitis, right shoulder: Secondary | ICD-10-CM

## 2023-06-22 DIAGNOSIS — M7540 Impingement syndrome of unspecified shoulder: Secondary | ICD-10-CM

## 2023-06-23 NOTE — Telephone Encounter (Signed)
 Requested medication (s) are due for refill today: yes  Requested medication (s) are on the active medication list: yes  Last refill:  05/25/23  Future visit scheduled: yes  Notes to clinic:  routing for review, another PCP listed.     Requested Prescriptions  Pending Prescriptions Disp Refills   meloxicam  (MOBIC ) 15 MG tablet [Pharmacy Med Name: MELOXICAM  15MG  TABLETS] 30 tablet 0    Sig: TAKE 1 TABLET(15 MG) BY MOUTH DAILY WITH FOOD FOR 2 WEEKS AS NEEDED     Analgesics:  COX2 Inhibitors Failed - 06/23/2023 10:18 AM      Failed - Manual Review: Labs are only required if the patient has taken medication for more than 8 weeks.      Failed - HGB in normal range and within 360 days    No results found for: "HGB", "HGBKUC", "HGBPOCKUC", "HGBOTHER", "TOTHGB", "HGBPLASMA"       Failed - Cr in normal range and within 360 days    Creatinine, Ser  Date Value Ref Range Status  11/12/2017 0.90 0.44 - 1.00 mg/dL Final         Failed - HCT in normal range and within 360 days    No results found for: "HCT", "HCTKUC", "SRHCT"       Failed - AST in normal range and within 360 days    No results found for: "POCAST", "AST"       Failed - ALT in normal range and within 360 days    No results found for: "ALT", "LABALT", "POCALT"       Failed - eGFR is 30 or above and within 360 days    GFR calc Af Amer  Date Value Ref Range Status  11/12/2017 >60 >60 mL/min Final    Comment:    (NOTE) The eGFR has been calculated using the CKD EPI equation. This calculation has not been validated in all clinical situations. eGFR's persistently <60 mL/min signify possible Chronic Kidney Disease. Performed at [facility], [address] [place], [place] [address]    GFR calc non Af Zara Heymann  Date Value Ref Range Status  11/12/2017 >60 >60 mL/min Final         Failed - Valid encounter within last 12 months    Recent Outpatient Visits           4 weeks ago Rotator cuff impingement  syndrome, unspecified laterality   [place] [facility] at [facility], Dessie Flow, MD       Future Appointments             In 2 days Augustus Ledger, Dessie Flow, MD [facility] at [facility]            Passed - Patient is not pregnant

## 2023-06-24 ENCOUNTER — Ambulatory Visit

## 2023-06-25 ENCOUNTER — Ambulatory Visit: Admitting: Family Medicine

## 2023-06-25 ENCOUNTER — Encounter: Payer: Self-pay | Admitting: Family Medicine

## 2023-06-25 VITALS — BP 114/74 | HR 73 | Ht 63.0 in | Wt 128.0 lb

## 2023-06-25 DIAGNOSIS — M7541 Impingement syndrome of right shoulder: Secondary | ICD-10-CM

## 2023-06-25 DIAGNOSIS — M7521 Bicipital tendinitis, right shoulder: Secondary | ICD-10-CM | POA: Diagnosis not present

## 2023-06-25 MED ORDER — DICLOFENAC SODIUM 50 MG PO TBEC: 50.0000 mg | DELAYED_RELEASE_TABLET | Freq: Two times a day (BID) | ORAL | 0 refills | Status: DC | PRN

## 2023-06-25 MED ORDER — PREDNISONE 20 MG PO TABS: 20.0000 mg | ORAL_TABLET | Freq: Two times a day (BID) | ORAL | 0 refills | Status: AC

## 2023-06-25 MED ORDER — CYCLOBENZAPRINE HCL 5 MG PO TABS: 5.0000 mg | ORAL_TABLET | Freq: Three times a day (TID) | ORAL | 0 refills | Status: AC | PRN

## 2023-06-25 NOTE — Patient Instructions (Signed)
 Patient Action Plan  Biceps Tendinitis and Rotator Cuff Syndrome: - Take prednisone 20 mg twice daily with food for 7 days. - If pain persists after completing prednisone, start diclofenac 50 mg twice daily as needed. - Use cyclobenzaprine (5-10 mg) up to three times daily if needed for muscle tightness. Be aware that it may cause drowsiness. - Follow the rotator cuff conditioning program focusing on stretching exercises. - Avoid overhead lifting and activities such as opening windows. - Consider an MRI if symptoms do not improve after medication to evaluate tendon involvement. - Discuss the possibility of a cortisone injection if pain continues despite medication.  Red Flags: - If you experience any new or worsening symptoms, such as increased pain or reduced mobility, contact your healthcare provider promptly.

## 2023-06-25 NOTE — Progress Notes (Signed)
 Primary Care / Sports Medicine Office Visit  Patient Information:  Patient ID: Ann Sandoval, female DOB: [birth year] Age: 73 y.o. MRN: [id number]   Ann Sandoval is a pleasant 73 y.o. female presenting with the following:  Chief Complaint  Patient presents with   Shoulder Pain    Patient presents today for a follow up on her bil shoulder pain. She has been attending PT but it did not help much at all.She has been taking Meloxicam  and it helps a little. She continues to have difficulty getting dressed, sleeping, and lifting.     Vitals:   06/25/23 1520  BP: 114/74  Pulse: 73  SpO2: 95%   Vitals:   06/25/23 1520  Weight: 128 lb (58.1 kg)  Height: 5' 3 (1.6 m)   Body mass index is 22.67 kg/m.  No results found.   Independent interpretation of notes and tests performed by another provider:   None  Procedures performed:   None  Pertinent History, Exam, Impression, and Recommendations:   Problem List Items Addressed This Visit     Biceps tendinitis, right   Biceps tendonitis Ongoing symptoms with positive Speed's and Yergason's tests, not improved with oral anti-inflammatories. - Include in treatment plan with prednisone and diclofenac as outlined for rotator cuff syndrome. - Advised gentle range of motion exercises to prevent adhesive capsulitis. - Consider MRI if symptoms persist to evaluate tendon involvement.      Relevant Medications   predniSONE (DELTASONE) 20 MG tablet   cyclobenzaprine (FLEXERIL) 5 MG tablet   diclofenac (VOLTAREN) 50 MG EC tablet   Rotator cuff impingement syndrome - Primary   History of Present Illness The patient presents with bilateral shoulder pain and limited range of motion.  The patient has experienced persistent bilateral shoulder pain for about a year, with the right shoulder being more symptomatic. The pain is exacerbated by overhead activities such as opening and closing windows and swimming, leading them to cease these  activities. They note that their windows are difficult to operate, which may have contributed to the shoulder strain.  They have undergone three sessions of physical therapy and have been taking meloxicam  once daily, but it has not provided significant relief, leading them to discontinue its use. They have attended four physical therapy sessions with three different therapists, who suggested a possible rotator cuff issue. They feel out of place in physical therapy, observing others with more severe conditions.  They can now hold a plank position without pain, indicating some variability in their symptoms. They have a large tabby cat weighing approximately fourteen pounds, which they occasionally lift, potentially contributing to the shoulder strain. They have been trying to avoid lifting and reaching activities since their last visit.  No new injuries or changes in activity level have been reported that could have exacerbated the condition. Current medications include meloxicam , which they have stopped due to ineffectiveness.  Physical Exam PALPATION: Non-tender bicipital groove. No crepitus, effusion, warmth, nodules, or bony abnormalities. RANGE OF MOTION: Full painless unrestricted abduction that is symmetric. Ten to fifteen degrees forward flexion deficit limited by pain. STRENGTH: 5/5 strength with internal and external rotation. SPECIAL TESTS: Positive Speed's test, equivocal to positive Yergason's. Positive Neer's and Hawkins test on the right.  Assessment and Plan Rotator cuff syndrome Chronic bilateral shoulder pain, supraspinatus involvement, possible partial tear. Exacerbated by overhead activities. - Prescribed prednisone for 7 days, twice daily with food. - If pain persists post-prednisone, initiate diclofenac twice daily as needed. - Consider  muscle relaxant (5-10 mg, 1-2 pills up to three times daily) for muscle tightness and sleep aid, noting drowsiness. - Provided rotator cuff  conditioning program focusing on stretching, avoiding strengthening. - Advised against overhead lifting and opening windows. - Discussed potential MRI if no improvement after medication to assess for partial tears. - Consider cortisone injection if pain persists despite oral medications.      Relevant Medications   predniSONE (DELTASONE) 20 MG tablet   cyclobenzaprine (FLEXERIL) 5 MG tablet   diclofenac (VOLTAREN) 50 MG EC tablet     Orders & Medications Medications:  Meds ordered this encounter  Medications   predniSONE (DELTASONE) 20 MG tablet    Sig: Take 1 tablet (20 mg total) by mouth 2 (two) times daily with a meal for 7 days.    Dispense:  14 tablet    Refill:  0   cyclobenzaprine (FLEXERIL) 5 MG tablet    Sig: Take 1-2 tablets (5-10 mg total) by mouth 3 (three) times daily as needed for muscle spasms.    Dispense:  60 tablet    Refill:  0   diclofenac (VOLTAREN) 50 MG EC tablet    Sig: Take 1 tablet (50 mg total) by mouth 2 (two) times daily as needed.    Dispense:  60 tablet    Refill:  0   No orders of the defined types were placed in this encounter.    Return if symptoms worsen or fail to improve.     Ma Saupe, MD, [facility]   Primary Care Sports Medicine Primary Care and Sports Medicine at [facility]

## 2023-06-25 NOTE — Assessment & Plan Note (Signed)
 History of Present Illness The patient presents with bilateral shoulder pain and limited range of motion.  The patient has experienced persistent bilateral shoulder pain for about a year, with the right shoulder being more symptomatic. The pain is exacerbated by overhead activities such as opening and closing windows and swimming, leading them to cease these activities. They note that their windows are difficult to operate, which may have contributed to the shoulder strain.  They have undergone three sessions of physical therapy and have been taking meloxicam  once daily, but it has not provided significant relief, leading them to discontinue its use. They have attended four physical therapy sessions with three different therapists, who suggested a possible rotator cuff issue. They feel out of place in physical therapy, observing others with more severe conditions.  They can now hold a plank position without pain, indicating some variability in their symptoms. They have a large tabby cat weighing approximately fourteen pounds, which they occasionally lift, potentially contributing to the shoulder strain. They have been trying to avoid lifting and reaching activities since their last visit.  No new injuries or changes in activity level have been reported that could have exacerbated the condition. Current medications include meloxicam , which they have stopped due to ineffectiveness.  Physical Exam PALPATION: Non-tender bicipital groove. No crepitus, effusion, warmth, nodules, or bony abnormalities. RANGE OF MOTION: Full painless unrestricted abduction that is symmetric. Ten to fifteen degrees forward flexion deficit limited by pain. STRENGTH: 5/5 strength with internal and external rotation. SPECIAL TESTS: Positive Speed's test, equivocal to positive Yergason's. Positive Neer's and Hawkins test on the right.  Assessment and Plan Rotator cuff syndrome Chronic bilateral shoulder pain, supraspinatus  involvement, possible partial tear. Exacerbated by overhead activities. - Prescribed prednisone for 7 days, twice daily with food. - If pain persists post-prednisone, initiate diclofenac twice daily as needed. - Consider muscle relaxant (5-10 mg, 1-2 pills up to three times daily) for muscle tightness and sleep aid, noting drowsiness. - Provided rotator cuff conditioning program focusing on stretching, avoiding strengthening. - Advised against overhead lifting and opening windows. - Discussed potential MRI if no improvement after medication to assess for partial tears. - Consider cortisone injection if pain persists despite oral medications.

## 2023-06-25 NOTE — Assessment & Plan Note (Signed)
 Biceps tendonitis Ongoing symptoms with positive Speed's and Yergason's tests, not improved with oral anti-inflammatories. - Include in treatment plan with prednisone and diclofenac as outlined for rotator cuff syndrome. - Advised gentle range of motion exercises to prevent adhesive capsulitis. - Consider MRI if symptoms persist to evaluate tendon involvement.

## 2023-06-26 ENCOUNTER — Telehealth: Payer: Self-pay

## 2023-06-26 NOTE — Telephone Encounter (Signed)
 Copied from CRM [id number]. Topic: Clinical - Medication Question >> Jun 26, 2023  2:55 PM Rosaria Common wrote: Reason for CRM: Pt calling to state that her 2 medications diclofenac  (VOLTAREN ) 50 MG EC tablet, and meloxicam  (MOBIC ) 15 MG tablet cannot be taken together and seeking medical advice. Callback number is [phone].

## 2023-06-26 NOTE — Telephone Encounter (Signed)
Please advise which medication patient should be taking

## 2023-06-29 ENCOUNTER — Encounter

## 2023-06-29 NOTE — Telephone Encounter (Signed)
 Patient informed.  - Bonny Egger M.

## 2023-07-01 ENCOUNTER — Encounter: Admitting: Physical Therapy

## 2023-07-06 ENCOUNTER — Encounter: Admitting: Physical Therapy

## 2023-07-08 ENCOUNTER — Encounter

## 2023-07-13 ENCOUNTER — Encounter

## 2023-07-15 ENCOUNTER — Encounter

## 2023-07-20 ENCOUNTER — Encounter: Admitting: Physical Therapy

## 2023-07-21 ENCOUNTER — Other Ambulatory Visit: Payer: Self-pay | Admitting: Family Medicine

## 2023-07-21 DIAGNOSIS — M7540 Impingement syndrome of unspecified shoulder: Secondary | ICD-10-CM

## 2023-07-21 DIAGNOSIS — M7521 Bicipital tendinitis, right shoulder: Secondary | ICD-10-CM

## 2023-07-22 ENCOUNTER — Encounter

## 2023-07-23 ENCOUNTER — Other Ambulatory Visit: Payer: Self-pay | Admitting: Family Medicine

## 2023-07-23 DIAGNOSIS — M7521 Bicipital tendinitis, right shoulder: Secondary | ICD-10-CM

## 2023-07-23 DIAGNOSIS — M7541 Impingement syndrome of right shoulder: Secondary | ICD-10-CM

## 2023-07-23 NOTE — Telephone Encounter (Signed)
 Requested medication (s) are due for refill today: yes  Requested medication (s) are on the active medication list: yes  Last refill:  06/23/23  Future visit scheduled: no  Notes to clinic:  Unable to refill per protocol due to failed labs, no updated results.      Requested Prescriptions  Pending Prescriptions Disp Refills   meloxicam  (MOBIC ) 15 MG tablet [Pharmacy Med Name: MELOXICAM  15MG  TABLETS] 30 tablet 0    Sig: TAKE 1 TABLET(15 MG) BY MOUTH DAILY WITH FOOD FOR 2 WEEKS AS NEEDED     Analgesics:  COX2 Inhibitors Failed - 07/23/2023 11:10 AM      Failed - Manual Review: Labs are only required if the patient has taken medication for more than 8 weeks.      Failed - HGB in normal range and within 360 days    No results found for: HGB, HGBKUC, HGBPOCKUC, HGBOTHER, TOTHGB, HGBPLASMA       Failed - Cr in normal range and within 360 days    Creatinine, Ser  Date Value Ref Range Status  11/12/2017 0.90 0.44 - 1.00 mg/dL Final         Failed - HCT in normal range and within 360 days    No results found for: HCT, HCTKUC, SRHCT       Failed - AST in normal range and within 360 days    No results found for: POCAST, AST       Failed - ALT in normal range and within 360 days    No results found for: ALT, LABALT, POCALT       Failed - eGFR is 30 or above and within 360 days    GFR calc Af Amer  Date Value Ref Range Status  11/12/2017 >60 >60 mL/min Final    Comment:    (NOTE) The eGFR has been calculated using the CKD EPI equation. This calculation has not been validated in all clinical situations. eGFR's persistently <60 mL/min signify possible Chronic Kidney Disease. Performed at [facility], [address], [place], [place] [address]    GFR calc non Af [place]  Date Value Ref Range Status  11/12/2017 >60 >60 mL/min Final         Passed - Patient is not pregnant      Passed - Valid encounter within last 12 months    Recent  Outpatient Visits           4 weeks ago Rotator cuff impingement syndrome of right shoulder   [place] [facility] at [facility], Selinda PARAS, MD   1 month ago Rotator cuff impingement syndrome, unspecified laterality   [id number] Medicine at [facility], Selinda PARAS, MD

## 2023-07-24 NOTE — Telephone Encounter (Signed)
 Requested medication (s) are due for refill today: yes  Requested medication (s) are on the active medication list: yes  Last refill:  06/25/23 #60/0  Future visit scheduled: no  Notes to clinic:  Unable to refill per protocol due to failed labs, no updated results.      Requested Prescriptions  Pending Prescriptions Disp Refills   diclofenac  (VOLTAREN ) 50 MG EC tablet [Pharmacy Med Name: DICLOFENAC  SODIUM 50MG  DR TABLETS] 60 tablet 0    Sig: TAKE 1 TABLET(50 MG) BY MOUTH TWICE DAILY AS NEEDED     Analgesics:  NSAIDS Failed - 07/24/2023  2:45 PM      Failed - Manual Review: Labs are only required if the patient has taken medication for more than 8 weeks.      Failed - Cr in normal range and within 360 days    Creatinine, Ser  Date Value Ref Range Status  11/12/2017 0.90 0.44 - 1.00 mg/dL Final         Failed - HGB in normal range and within 360 days    No results found for: HGB, HGBKUC, HGBPOCKUC, HGBOTHER, TOTHGB, HGBPLASMA       Failed - PLT in normal range and within 360 days    No results found for: PLT, PLTCOUNTKUC, LABPLAT, POCPLA       Failed - HCT in normal range and within 360 days    No results found for: HCT, HCTKUC, SRHCT       Failed - eGFR is 30 or above and within 360 days    GFR calc Af Amer  Date Value Ref Range Status  11/12/2017 >60 >60 mL/min Final    Comment:    (NOTE) The eGFR has been calculated using the CKD EPI equation. This calculation has not been validated in all clinical situations. eGFR's persistently <60 mL/min signify possible Chronic Kidney Disease. Performed at [facility], [address], [place], [place] [address]    GFR calc non Af Ann Sandoval  Date Value Ref Range Status  11/12/2017 >60 >60 mL/min Final         Passed - Patient is not pregnant      Passed - Valid encounter within last 12 months    Recent Outpatient Visits           4 weeks ago Rotator cuff impingement syndrome of right  shoulder   [place] [facility] at [facility], Selinda PARAS, MD   2 months ago Rotator cuff impingement syndrome, unspecified laterality   [facility] Medicine at [facility], Selinda PARAS, MD

## 2023-07-27 ENCOUNTER — Encounter: Admitting: Physical Therapy

## 2023-07-27 NOTE — Telephone Encounter (Signed)
 Called and could not reach pt. Pt needs to schedule follow up with Dr Alvia if she calls back.

## 2023-07-29 ENCOUNTER — Encounter

## 2023-08-03 ENCOUNTER — Encounter

## 2023-08-05 ENCOUNTER — Encounter

## 2023-08-10 ENCOUNTER — Encounter: Admitting: Physical Therapy

## 2023-08-11 DIAGNOSIS — L089 Local infection of the skin and subcutaneous tissue, unspecified: Secondary | ICD-10-CM | POA: Diagnosis not present

## 2023-08-11 DIAGNOSIS — S0086XA Insect bite (nonvenomous) of other part of head, initial encounter: Secondary | ICD-10-CM | POA: Diagnosis not present

## 2023-08-11 DIAGNOSIS — W57XXXA Bitten or stung by nonvenomous insect and other nonvenomous arthropods, initial encounter: Secondary | ICD-10-CM | POA: Diagnosis not present

## 2023-08-12 ENCOUNTER — Encounter

## 2023-08-12 DIAGNOSIS — K080 Exfoliation of teeth due to systemic causes: Secondary | ICD-10-CM | POA: Diagnosis not present

## 2023-08-17 ENCOUNTER — Encounter: Admitting: Physical Therapy

## 2023-08-19 ENCOUNTER — Encounter: Admitting: Physical Therapy

## 2023-08-24 ENCOUNTER — Other Ambulatory Visit: Payer: Self-pay | Admitting: Family Medicine

## 2023-08-24 ENCOUNTER — Encounter: Admitting: Physical Therapy

## 2023-08-24 DIAGNOSIS — M7521 Bicipital tendinitis, right shoulder: Secondary | ICD-10-CM

## 2023-08-24 DIAGNOSIS — M7541 Impingement syndrome of right shoulder: Secondary | ICD-10-CM

## 2023-08-26 ENCOUNTER — Encounter: Admitting: Physical Therapy

## 2023-08-26 NOTE — Telephone Encounter (Signed)
 Copied from CRM [id number]. Topic: Clinical - Medication Refill >> Aug 26, 2023  8:15 AM Tinnie BROCKS wrote: Norleen from [facility] is calling to confirm receipt of a refill request. Requesting call from nursing staff at [phone].  Medication: diclofenac  (VOLTAREN ) 50 MG EC tablet  Has the patient contacted their pharmacy? Yes (Agent: If no, request that the patient contact the pharmacy for the refill. If patient does not wish to contact the pharmacy document the reason why and proceed with request.) (Agent: If yes, when and what did the pharmacy advise?)  This is the patient's preferred pharmacy:  [id number] [place], [place] - [address] AT [facility] [address] [place] [address] [place] [address] Phone: [phone] Fax: [phone]   Has the prescription been filled recently? Yes  Is the patient out of the medication? No  Has the patient been seen for an appointment in the last year OR does the patient have an upcoming appointment? Yes  Can we respond through MyChart? Yes  Agent: Please be advised that Rx refills may take up to 3 business days. We ask that you follow-up with your pharmacy.

## 2023-08-27 NOTE — Telephone Encounter (Signed)
 Requested medication (s) are due for refill today: yes  Requested medication (s) are on the active medication list: yes  Last refill:  07/27/23 #60  Future visit scheduled: no  Notes to clinic:  overdue and missing lab work   Requested Prescriptions  Pending Prescriptions Disp Refills   diclofenac  (VOLTAREN ) 50 MG EC tablet [Pharmacy Med Name: DICLOFENAC  SODIUM 50MG  DR TABLETS] 60 tablet 0    Sig: TAKE 1 TABLET(50 MG) BY MOUTH TWICE DAILY AS NEEDED     Analgesics:  NSAIDS Failed - 08/27/2023  9:25 AM      Failed - Manual Review: Labs are only required if the patient has taken medication for more than 8 weeks.      Failed - Cr in normal range and within 360 days    Creatinine, Ser  Date Value Ref Range Status  11/12/2017 0.90 0.44 - 1.00 mg/dL Final         Failed - HGB in normal range and within 360 days    No results found for: HGB, HGBKUC, HGBPOCKUC, HGBOTHER, TOTHGB, HGBPLASMA       Failed - PLT in normal range and within 360 days    No results found for: PLT, PLTCOUNTKUC, LABPLAT, POCPLA       Failed - HCT in normal range and within 360 days    No results found for: HCT, HCTKUC, SRHCT       Failed - eGFR is 30 or above and within 360 days    GFR calc Af Amer  Date Value Ref Range Status  11/12/2017 >60 >60 mL/min Final    Comment:    (NOTE) The eGFR has been calculated using the CKD EPI equation. This calculation has not been validated in all clinical situations. eGFR's persistently <60 mL/min signify possible Chronic Kidney Disease. Performed at [facility], [address], [place], [place] [address]    GFR calc non Af [place]  Date Value Ref Range Status  11/12/2017 >60 >60 mL/min Final         Passed - Patient is not pregnant      Passed - Valid encounter within last 12 months    Recent Outpatient Visits           2 months ago Rotator cuff impingement syndrome of right shoulder   [place] [facility], Selinda PARAS, MD   3 months ago Rotator cuff impingement syndrome, unspecified laterality   [facility] at [facility], Selinda PARAS, MD

## 2023-08-31 ENCOUNTER — Encounter

## 2023-09-02 ENCOUNTER — Encounter: Admitting: Physical Therapy

## 2023-09-07 ENCOUNTER — Encounter: Admitting: Physical Therapy

## 2023-09-09 ENCOUNTER — Encounter: Admitting: Physical Therapy

## 2023-09-15 ENCOUNTER — Encounter

## 2023-09-17 ENCOUNTER — Encounter

## 2023-09-17 DIAGNOSIS — H9313 Tinnitus, bilateral: Secondary | ICD-10-CM | POA: Diagnosis not present

## 2023-09-17 DIAGNOSIS — F064 Anxiety disorder due to known physiological condition: Secondary | ICD-10-CM | POA: Diagnosis not present

## 2023-09-17 DIAGNOSIS — H903 Sensorineural hearing loss, bilateral: Secondary | ICD-10-CM | POA: Diagnosis not present

## 2023-09-21 ENCOUNTER — Encounter: Admitting: Physical Therapy

## 2023-09-23 ENCOUNTER — Encounter: Admitting: Physical Therapy

## 2023-09-28 ENCOUNTER — Encounter: Admitting: Physical Therapy

## 2023-09-30 ENCOUNTER — Encounter: Admitting: Physical Therapy
